# Patient Record
Sex: Female | Born: 1980
Health system: Southern US, Community
[De-identification: ages and names within clinical notes are randomized; demographics above are authoritative.]

## PROBLEM LIST (undated history)

## (undated) DIAGNOSIS — I10 Essential (primary) hypertension: Secondary | ICD-10-CM

## (undated) DIAGNOSIS — Z8489 Family history of other specified conditions: Secondary | ICD-10-CM

## (undated) DIAGNOSIS — F32A Depression, unspecified: Secondary | ICD-10-CM

## (undated) DIAGNOSIS — F319 Bipolar disorder, unspecified: Secondary | ICD-10-CM

## (undated) DIAGNOSIS — Z87442 Personal history of urinary calculi: Secondary | ICD-10-CM

## (undated) DIAGNOSIS — F419 Anxiety disorder, unspecified: Secondary | ICD-10-CM

## (undated) DIAGNOSIS — K219 Gastro-esophageal reflux disease without esophagitis: Secondary | ICD-10-CM

## (undated) DIAGNOSIS — E669 Obesity, unspecified: Secondary | ICD-10-CM

## (undated) DIAGNOSIS — F329 Major depressive disorder, single episode, unspecified: Secondary | ICD-10-CM

## (undated) HISTORY — DX: Gastro-esophageal reflux disease without esophagitis: K21.9

## (undated) HISTORY — DX: Obesity, unspecified: E66.9

## (undated) HISTORY — DX: Anxiety disorder, unspecified: F41.9

## (undated) HISTORY — PX: WISDOM TOOTH EXTRACTION: SHX21

---

## 2016-07-13 ENCOUNTER — Emergency Department (INDEPENDENT_AMBULATORY_CARE_PROVIDER_SITE_OTHER)
Admission: EM | Admit: 2016-07-13 | Discharge: 2016-07-13 | Disposition: A | Discharge status: Home / Self Care | Payer: BLUE CROSS/BLUE SHIELD | Source: Home / Self Care | Attending: Family Medicine | Admitting: Family Medicine

## 2016-07-13 ENCOUNTER — Emergency Department (INDEPENDENT_AMBULATORY_CARE_PROVIDER_SITE_OTHER): Payer: BLUE CROSS/BLUE SHIELD

## 2016-07-13 DIAGNOSIS — S90212A Contusion of left great toe with damage to nail, initial encounter: Secondary | ICD-10-CM

## 2016-07-13 DIAGNOSIS — X58XXXA Exposure to other specified factors, initial encounter: Secondary | ICD-10-CM | POA: Diagnosis not present

## 2016-07-13 DIAGNOSIS — S90112A Contusion of left great toe without damage to nail, initial encounter: Secondary | ICD-10-CM

## 2016-07-13 HISTORY — DX: Major depressive disorder, single episode, unspecified: F32.9

## 2016-07-13 HISTORY — DX: Bipolar disorder, unspecified: F31.9

## 2016-07-13 HISTORY — DX: Depression, unspecified: F32.A

## 2016-07-13 MED ORDER — IBUPROFEN 400 MG PO TABS
400.0000 mg | ORAL_TABLET | Freq: Once | ORAL | Status: AC
  Administered 2016-07-13: 400 mg via ORAL

## 2016-07-13 MED ORDER — TETANUS-DIPHTH-ACELL PERTUSSIS 5-2.5-18.5 LF-MCG/0.5 IM SUSP
0.5000 mL | Freq: Once | INTRAMUSCULAR | Status: AC
  Administered 2016-07-13: 0.5 mL via INTRAMUSCULAR

## 2016-07-13 NOTE — ED Provider Notes (Signed)
[facility]    CSN: [id number] Arrival date & time: 07/13/16  1818     History   Chief Complaint Chief Complaint  Patient presents with  . Foot Pain    HPI Debra Andrade is a 36 y.o. female.   Patient bumped her left great toe on a metal door about 6 hours ago.  She does not remember her last Tdap.   The history is provided by the patient.  Toe Pain  This is a new problem. The current episode started 6 to 12 hours ago. The problem occurs constantly. The problem has not changed since onset.The symptoms are aggravated by walking. Nothing relieves the symptoms. He has tried nothing for the symptoms.    Past Medical History:  Diagnosis Date  . Bipolar affective (HCC)   . Depression     There are no active problems to display for this patient.   History reviewed. No pertinent surgical history.     Home Medications    Prior to Admission medications   Medication Sig Start Date End Date Taking? Authorizing Provider  fluvoxaMINE (LUVOX) 100 MG tablet Take 100 mg by mouth at bedtime.   Yes Historical Provider, MD  gabapentin (NEURONTIN) 300 MG capsule Take 300 mg by mouth 3 (three) times daily.   Yes Historical Provider, MD  lamoTRIgine (LAMICTAL) 150 MG tablet Take 300 mg by mouth daily.   Yes Historical Provider, MD  lurasidone (LATUDA) 40 MG TABS tablet Take 40 mg by mouth daily with breakfast.   Yes Historical Provider, MD    Family History History reviewed. No pertinent family history.  Social History Social History  Substance Use Topics  . Smoking status: Never Smoker  . Smokeless tobacco: Never Used  . Alcohol use No     Allergies   Amoxicillin   Review of Systems Review of Systems  All other systems reviewed and are negative.    Physical Exam Triage Vital Signs ED Triage Vitals  Enc Vitals Group     BP 07/13/16 1850 (!) 154/94     Pulse Rate 07/13/16 1850 82     Resp --      Temp 07/13/16 1850 98 F (36.7 C)     Temp Source  07/13/16 1850 Oral     SpO2 07/13/16 1850 97 %     Weight 07/13/16 1850 241 lb (109.3 kg)     Height 07/13/16 1850  (1.702 m)     Head Circumference --      Peak Flow --      Pain Score 07/13/16 1851 5     Pain Loc --      Pain Edu? --      Excl. in GC? --    No data found.   Updated Vital Signs BP (!) 154/94 (BP Location: Left Arm)   Pulse 82   Temp 98 F (36.7 C) (Oral)   Ht  (1.702 m)   Wt 241 lb (109.3 kg)   SpO2 97%   BMI 37.75 kg/m   Visual Acuity Right Eye Distance:   Left Eye Distance:   Bilateral Distance:    Right Eye Near:   Left Eye Near:    Bilateral Near:     Physical Exam  Constitutional: He appears well-developed and well-nourished. No distress.  HENT:  Head: Atraumatic.  Eyes: Pupils are equal, round, and reactive to light.  Cardiovascular: Normal rate.   Pulmonary/Chest: Effort normal.  Musculoskeletal: He exhibits tenderness. He exhibits  no deformity.       Left foot: There is tenderness. There is normal range of motion.       Feet:  Left great toe has good range of motion.  There is a superficial abrasion and tenderness at the medial edge of toenail as noted on diagram.  Toenail is intact; no laceration.  Neurological: He is alert.  Skin: Skin is warm and dry.  Nursing note and vitals reviewed.    UC Treatments / Results  Labs (all labs ordered are listed, but only abnormal results are displayed) Labs Reviewed - No data to display  EKG  EKG Interpretation None       Radiology Dg Toe Great Left  Result Date: 07/13/2016 CLINICAL DATA:  Big toe injury. EXAM: LEFT GREAT TOE COMPARISON:  None. FINDINGS: There is no evidence of fracture or dislocation. There is no evidence of arthropathy or other focal bone abnormality. Soft tissue irregularity of the nail suggest laceration. IMPRESSION: No acute bony abnormality. Electronically Signed   By: Kennith Center M.D.   On: 07/13/2016 19:54    Procedures Procedures (including  critical care time)  Medications Ordered in UC Medications  ibuprofen (ADVIL,MOTRIN) tablet 400 mg (400 mg Oral Given 07/13/16 1859)  Tdap (BOOSTRIX) injection 0.5 mL (0.5 mLs Intramuscular Given 07/13/16 1950)     Initial Impression / Assessment and Plan / UC Course  I have reviewed the triage vital signs and the nursing notes.  Pertinent labs & imaging results that were available during my care of the patient were reviewed by me and considered in my medical decision making (see chart for details).    Administered Tdap  Applied Bacitracin ointment and wrap with Xeroform gauzed, followed by Kerlix gauze and light wrap of CoBan. Change bandage daily and apply Bacitracin ointment until healed.  Elevate leg.  Apply ice pack for 20 to 30 minutes, 3 to 4 times daily  Continue until pain and swelling decrease.  May take Ibuprofen or Tylenol as needed for pain. Keep wound clean and dry.  Return for any signs of infection (or follow-up with family doctor):  Increasing redness, swelling, pain, heat, drainage, etc.    Final Clinical Impressions(s) / UC Diagnoses   Final diagnoses:  Contusion of left great toe with damage to nail, initial encounter    New Prescriptions New Prescriptions   No medications on file     Lattie Haw, MD 07/25/16 1410

## 2016-07-13 NOTE — ED Triage Notes (Signed)
Pt opened metal door today and hit big toe.  Bleeding noted, throbs then has sharp pain between the nail and the joint.

## 2016-07-13 NOTE — Discharge Instructions (Signed)
Change bandage daily and apply Bacitracin ointment until healed.  Elevate leg.  Apply ice pack for 20 to 30 minutes, 3 to 4 times daily  Continue until pain and swelling decrease.  May take Ibuprofen or Tylenol as needed for pain. Keep wound clean and dry.  Return for any signs of infection (or follow-up with family doctor):  Increasing redness, swelling, pain, heat, drainage, etc.

## 2017-05-18 ENCOUNTER — Ambulatory Visit: Payer: BLUE CROSS/BLUE SHIELD | Admitting: Osteopathic Medicine

## 2017-05-18 ENCOUNTER — Telehealth: Payer: Self-pay | Admitting: Osteopathic Medicine

## 2017-05-18 NOTE — Telephone Encounter (Signed)
Called pt and lvm to try and get her rescheduled and informed her of policy. Thanks

## 2017-05-18 NOTE — Telephone Encounter (Signed)
NO SHOW to est care w. Dr Lyn HollingsheadAlexander 05/18/17. If late/no-show again will not accept pt at this practice, please call to reschedule and inform them of this policy, thanks.

## 2017-05-19 ENCOUNTER — Ambulatory Visit: Payer: BLUE CROSS/BLUE SHIELD | Admitting: Osteopathic Medicine

## 2017-05-31 ENCOUNTER — Ambulatory Visit (INDEPENDENT_AMBULATORY_CARE_PROVIDER_SITE_OTHER): Payer: 59 | Admitting: Physician Assistant

## 2017-05-31 ENCOUNTER — Encounter: Payer: Self-pay | Admitting: Physician Assistant

## 2017-05-31 ENCOUNTER — Encounter (INDEPENDENT_AMBULATORY_CARE_PROVIDER_SITE_OTHER): Payer: Self-pay

## 2017-05-31 VITALS — BP 150/80 | HR 97 | Ht 67.0 in | Wt 241.0 lb

## 2017-05-31 DIAGNOSIS — F419 Anxiety disorder, unspecified: Secondary | ICD-10-CM

## 2017-05-31 DIAGNOSIS — R195 Other fecal abnormalities: Secondary | ICD-10-CM | POA: Diagnosis not present

## 2017-05-31 DIAGNOSIS — Z1329 Encounter for screening for other suspected endocrine disorder: Secondary | ICD-10-CM

## 2017-05-31 DIAGNOSIS — R030 Elevated blood-pressure reading, without diagnosis of hypertension: Secondary | ICD-10-CM | POA: Diagnosis not present

## 2017-05-31 DIAGNOSIS — Z7689 Persons encountering health services in other specified circumstances: Secondary | ICD-10-CM | POA: Diagnosis not present

## 2017-05-31 DIAGNOSIS — K219 Gastro-esophageal reflux disease without esophagitis: Secondary | ICD-10-CM | POA: Diagnosis not present

## 2017-05-31 DIAGNOSIS — Z131 Encounter for screening for diabetes mellitus: Secondary | ICD-10-CM

## 2017-05-31 DIAGNOSIS — Z130 Encounter for screening for diseases of the blood and blood-forming organs and certain disorders involving the immune mechanism: Secondary | ICD-10-CM

## 2017-05-31 DIAGNOSIS — Z13220 Encounter for screening for lipoid disorders: Secondary | ICD-10-CM

## 2017-05-31 MED ORDER — FLUVOXAMINE MALEATE 100 MG PO TABS: 100.0000 mg | ORAL_TABLET | Freq: Every day | ORAL | 3 refills | Status: DC

## 2017-05-31 NOTE — Progress Notes (Signed)
HPI:                                                                Debra Andrade is a 37 y.o. female who presents to [name] today to establish care  Current concerns include: digestive issues  Reports for the last 3-4 months she has had a change in her bowel habits. Describes loose, discolored stools about half the time when she has a BM. She has had 2 occasions where she has seen a long, white stringy substance in her stool. She is stooling once daily. Denies frequency, urgency, or tenesmus. Denies constitutional symptoms, abdominal pain, nausea/vomting, hematochezia or melena. Denies recent travel. Denies recent antibiotic use. Eats a regular diet ,but does endorse increased fast food intake.   GERD: has been taking OTC Prilosec for the last 2 weeks. Father has Barrett's esophagus and esophageal cancer.  Bipolar 2: she reports she self-tapered her medications in early November because she was feeling good. Not currently followed by Psychiatry, but plans to return to [facility] as needed. States her mood has been great, denies symptoms of mania or hypomania. Reports she has noted increased anxiety, gradually worsening. She has a history of obsessions/compulsions and was taking Luvox for this.   No flowsheet data found.  No flowsheet data found.    Past Medical History:  Diagnosis Date  . Anxiety   . Bipolar affective (HCC)   . Depression   . GERD (gastroesophageal reflux disease)    No past surgical history on file. Social History   Tobacco Use  . Smoking status: Never Smoker  . Smokeless tobacco: Never Used  Substance Use Topics  . Alcohol use: No   family history includes Barrett's esophagus in her father; Esophageal cancer in her father; Kidney disease in her mother.    ROS: Review of Systems  Constitutional: Negative.   Eyes: Positive for blurred vision.  Gastrointestinal: Positive for diarrhea (loose stools).  Negative for abdominal pain, blood in stool, melena, nausea and vomiting.       + early satiety  Skin: Positive for rash.  Endo/Heme/Allergies: Positive for environmental allergies.  Psychiatric/Behavioral: The patient is nervous/anxious.      Medications: Current Outpatient Medications  Medication Sig Dispense Refill  . cetirizine (ZYRTEC) 10 MG tablet Take 10 mg by mouth daily.    . Cranberry 360 MG CAPS Take by mouth.    . cyanocobalamin 500 MCG tablet Take 500 mcg by mouth daily.    Marland Kitchen omeprazole (PRILOSEC) 20 MG capsule Take 20 mg by mouth daily.    . potassium citrate (UROCIT-K) 10 MEQ (1080 MG) SR tablet Take 10 mEq by mouth 3 (three) times daily with meals.    . fluvoxaMINE (LUVOX) 100 MG tablet Take 1 tablet (100 mg total) by mouth at bedtime. 30 tablet 3   No current facility-administered medications for this visit.    Allergies  Allergen Reactions  . Amoxicillin        Objective:  BP (!) 150/80   Pulse 97   Ht 5\' 7"  (1.702 m)   Wt 241 lb (109.3 kg)   LMP 05/15/2017 (Exact Date)   BMI 37.75 kg/m  Gen:  alert, not ill-appearing, no distress, appropriate for age,  obese female HEENT: head normocephalic without obvious abnormality, conjunctiva and cornea clear, wearing glasses, oropharynx clear,cobblestoning of posterior tongue, moist mucous membranes, neck supple, no adenopathy, trachea midline Pulm: Normal work of breathing, normal phonation, clear to auscultation bilaterally, no wheezes, rales or rhonchi CV: Normal rate, regular rhythm, s1 and s2 distinct, no murmurs, clicks or rubs  GI: abdomen obese, soft, distended, no tenderness, rigidity or guarding, negative Murphy's sign, no palpable masses Neuro: alert and oriented x 3, no tremor MSK: extremities atraumatic, normal gait and station Skin: intact, no rashes on exposed skin, no jaundice, no cyanosis Psych: well-groomed, cooperative, good eye contact, euthymic mood, affect mood-congruent, speech is articulate,  and thought processes clear and goal-directed    No results found for this or any previous visit (from the past 72 hour(s)). No results found.    Assessment and Plan: 37 y.o. female with  1. Encounter to establish care - reviewed PMH, PSH, PFh, medications and allergies - reviewed health maintenance - due for Pap smear - declines influenza - Tdap UTD  2. Elevated blood pressure reading BP Readings from Last 3 Encounters:  05/31/17 (!) 150/80  07/13/16 (!) 154/94  -counseled on therapeutic lifestyle changes - patient to monitor and log readings at home  3. Anxiety disorder, unspecified type - re-starting patients luvox, 50 mg QHS for 3 days then 100 mg QHS. She will follow-up with psychiatry as needed for mood/bipolar symptoms - fluvoxaMINE (LUVOX) 100 MG tablet; Take 1 tablet (100 mg total) by mouth at bedtime.  Dispense: 30 tablet; Refill: 3  4. Loose stools - she has no risk factors for C. Diff. Checking labs, stool culture and ova/parasite - CBC with Differential/Platelet - Comprehensive metabolic panel - Hemoglobin A1c - Lipid Panel w/reflex Direct LDL - Lipase - Ova and parasite examination - Stool Culture  5. Gastroesophageal reflux disease, esophagitis presence not specified - no red flag symptoms - cont Omeprazole 20 mg daily - GERD diet  6. Screening for blood disease - CBC with Differential/Platelet - Comprehensive metabolic panel  7. Screening for diabetes mellitus (DM) - Hemoglobin A1c  8. Screening for lipid disorders - Lipid Panel w/reflex Direct LDL  9. Screening for thyroid disorder - TSH + free T4  Patient education and anticipatory guidance given Patient agrees with treatment plan Follow-up in 4 weeks for digestive issues/anxiety or sooner as needed if symptoms worsen or fail to improve  Levonne Hubertharley E. Arion Morgan PA-C

## 2017-05-31 NOTE — Patient Instructions (Addendum)
For loose stools: - labs and stool studies today - food diary - continue the Prilosec 20 mg daily - follow-up in 4 weeks  For your blood pressure: - Goal <130/80 - stop Sudafed and avoid oral decongestants - monitor and log blood pressures at home - check around the same time each day in a relaxed setting - Limit salt to <2000 mg/day - Follow DASH eating plan - limit alcohol to 2 standard drinks per day for men and 1 per day for women - avoid tobacco products - weight loss: 7% of current body weight - follow-up every 6 months for your blood pressure   For anxiety: - re-start Luvox. Start with 1/2 tablet at bedtime for 3 days and then increase to full tablet at bedtime

## 2017-06-01 LAB — CBC WITH DIFFERENTIAL/PLATELET
BASOS PCT: 0.7 %
Basophils Absolute: 78 cells/uL (ref 0–200)
EOS ABS: 56 {cells}/uL (ref 15–500)
Eosinophils Relative: 0.5 %
HEMATOCRIT: 44.2 % (ref 35.0–45.0)
Hemoglobin: 14.9 g/dL (ref 11.7–15.5)
LYMPHS ABS: 3091 {cells}/uL (ref 850–3900)
MCH: 30.3 pg (ref 27.0–33.0)
MCHC: 33.7 g/dL (ref 32.0–36.0)
MCV: 90 fL (ref 80.0–100.0)
MPV: 9.9 fL (ref 7.5–12.5)
Monocytes Relative: 5 %
Neutro Abs: 7414 cells/uL (ref 1500–7800)
Neutrophils Relative %: 66.2 %
PLATELETS: 352 10*3/uL (ref 140–400)
RBC: 4.91 10*6/uL (ref 3.80–5.10)
RDW: 12.1 % (ref 11.0–15.0)
TOTAL LYMPHOCYTE: 27.6 %
WBC: 11.2 10*3/uL — ABNORMAL HIGH (ref 3.8–10.8)
WBCMIX: 560 {cells}/uL (ref 200–950)

## 2017-06-01 LAB — COMPREHENSIVE METABOLIC PANEL
AG Ratio: 1.8 (calc) (ref 1.0–2.5)
ALBUMIN MSPROF: 4.6 g/dL (ref 3.6–5.1)
ALT: 18 U/L (ref 6–29)
AST: 16 U/L (ref 10–30)
Alkaline phosphatase (APISO): 71 U/L (ref 33–115)
BILIRUBIN TOTAL: 0.6 mg/dL (ref 0.2–1.2)
BUN: 13 mg/dL (ref 7–25)
CO2: 22 mmol/L (ref 20–32)
CREATININE: 0.76 mg/dL (ref 0.50–1.10)
Calcium: 9.7 mg/dL (ref 8.6–10.2)
Chloride: 107 mmol/L (ref 98–110)
GLUCOSE: 88 mg/dL (ref 65–99)
Globulin: 2.6 g/dL (calc) (ref 1.9–3.7)
POTASSIUM: 4.2 mmol/L (ref 3.5–5.3)
SODIUM: 140 mmol/L (ref 135–146)
TOTAL PROTEIN: 7.2 g/dL (ref 6.1–8.1)

## 2017-06-01 LAB — LIPID PANEL W/REFLEX DIRECT LDL
CHOLESTEROL: 210 mg/dL — AB (ref <200)
HDL: 56 mg/dL (ref >50)
LDL CHOLESTEROL (CALC): 130 mg/dL — AB
Non-HDL Cholesterol (Calc): 154 mg/dL (calc) — ABNORMAL HIGH (ref <130)
Total CHOL/HDL Ratio: 3.8 (calc) (ref <5.0)
Triglycerides: 127 mg/dL (ref <150)

## 2017-06-01 LAB — HEMOGLOBIN A1C
EAG (MMOL/L): 5.5 (calc)
HEMOGLOBIN A1C: 5.1 %{Hb} (ref <5.7)
Mean Plasma Glucose: 100 (calc)

## 2017-06-01 LAB — LIPASE: Lipase: 10 U/L (ref 7–60)

## 2017-06-01 LAB — TSH+FREE T4: TSH W/REFLEX TO FT4: 2.99 m[IU]/L

## 2017-06-03 NOTE — Progress Notes (Signed)
Hi Debra Andrade,  Your labs look good overall!  - you have a mild increase in your total white blood cell count, but all of your other cell lines are normal, so this is reassuring. It may indicate that the cause of your stool abnormalities is due to an infection. Your stool studies are still pending. - your cholesterol is borderline elevated. Work on [employer]-fat diet and regular aerobic exercise to help lower this.  Best, Vinetta Bergamoharley

## 2017-06-06 LAB — STOOL CULTURE
MICRO NUMBER:: 90294052
MICRO NUMBER:: 90294053
MICRO NUMBER:: 90294054
SHIGA RESULT:: NOT DETECTED
SPECIMEN QUALITY: ADEQUATE
SPECIMEN QUALITY: ADEQUATE
SPECIMEN QUALITY:: ADEQUATE

## 2017-06-07 ENCOUNTER — Ambulatory Visit: Payer: Self-pay | Admitting: Physician Assistant

## 2017-06-09 LAB — OVA AND PARASITE EXAMINATION
CONCENTRATE RESULT: NONE SEEN
TRICHROME RESULT: NONE SEEN

## 2017-06-09 NOTE — Progress Notes (Signed)
Good morning Lakynn,  Good news: your stool studies were negative for bacteria and parasites.  Best, Vinetta Bergamoharley

## 2017-06-16 ENCOUNTER — Telehealth: Payer: Self-pay

## 2017-06-16 NOTE — Telephone Encounter (Signed)
Received a return letter due to incorrect address. Patient's address needs to be updated.

## 2017-06-30 ENCOUNTER — Ambulatory Visit: Payer: 59 | Admitting: Physician Assistant

## 2017-06-30 ENCOUNTER — Encounter: Payer: 59 | Admitting: Obstetrics & Gynecology

## 2017-06-30 DIAGNOSIS — Z01419 Encounter for gynecological examination (general) (routine) without abnormal findings: Secondary | ICD-10-CM

## 2017-07-28 ENCOUNTER — Ambulatory Visit: Payer: 59 | Admitting: Physician Assistant

## 2017-08-02 ENCOUNTER — Ambulatory Visit: Payer: 59 | Admitting: Physician Assistant

## 2018-01-13 ENCOUNTER — Ambulatory Visit (INDEPENDENT_AMBULATORY_CARE_PROVIDER_SITE_OTHER): Payer: BLUE CROSS/BLUE SHIELD | Admitting: Obstetrics & Gynecology

## 2018-01-13 ENCOUNTER — Encounter: Payer: Self-pay | Admitting: Obstetrics & Gynecology

## 2018-01-13 DIAGNOSIS — Z124 Encounter for screening for malignant neoplasm of cervix: Secondary | ICD-10-CM

## 2018-01-13 DIAGNOSIS — Z1151 Encounter for screening for human papillomavirus (HPV): Secondary | ICD-10-CM | POA: Diagnosis not present

## 2018-01-13 DIAGNOSIS — Z23 Encounter for immunization: Secondary | ICD-10-CM

## 2018-01-13 DIAGNOSIS — Z01419 Encounter for gynecological examination (general) (routine) without abnormal findings: Secondary | ICD-10-CM | POA: Diagnosis not present

## 2018-01-13 NOTE — Progress Notes (Signed)
Subjective:    Debra Andrade is a 37 y.o. married P0 female who presents for an annual exam. The patient has no complaints today. The patient is sexually active rarely, last about 6 years. GYN screening history: last pap: was normal. The patient wears seatbelts: yes. The patient participates in regular exercise: no. Has the patient ever been transfused or tattooed?: no. The patient reports that there is not domestic violence in her life.   Menstrual History: OB History    Gravida  1   Para  0   Term      Preterm      AB  1   Living        SAB      TAB      Ectopic      Multiple      Live Births              Menarche age: 28 Patient's last menstrual period was 01/04/2018.    The following portions of the patient's history were reviewed and updated as appropriate: allergies, current medications, past family history, past medical history, past social history, past surgical history and problem list.  Review of Systems Pertinent items are noted in HPI.   Fh-maybe maternal GM, paternal aunt, + colon cancer- paternal GF, no gyn Works at [employer] Married for 10 years     Objective:    BP (!) 148/95   Pulse (!) 105   Resp 16   Ht 5\' 7"  (1.702 m)   Wt 253 lb (114.8 kg)   LMP 01/04/2018   BMI 39.63 kg/m   General Appearance:    Alert, cooperative, no distress, appears stated age  Head:    Normocephalic, without obvious abnormality, atraumatic  Eyes:    PERRL, conjunctiva/corneas clear, EOM's intact, fundi    benign, both eyes  Ears:    Normal TM's and external ear canals, both ears  Nose:   Nares normal, septum midline, mucosa normal, no drainage    or sinus tenderness  Throat:   Lips, mucosa, and tongue normal; teeth and gums normal  Neck:   Supple, symmetrical, trachea midline, no adenopathy;    thyroid:  no enlargement/tenderness/nodules; no carotid   bruit or JVD  Back:     Symmetric, no curvature, ROM normal, no CVA tenderness  Lungs:     Clear to  auscultation bilaterally, respirations unlabored  Chest Wall:    No tenderness or deformity   Heart:    Regular rate and rhythm, S1 and S2 normal, no murmur, rub   or gallop  Breast Exam:    No tenderness, masses, or nipple abnormality  Abdomen:     Soft, non-tender, bowel sounds active all four quadrants,    no masses, no organomegaly  Genitalia:    Normal female without lesion, discharge or tenderness, cervix appears normal, no masses palpable with bimanual exam     Extremities:   Extremities normal, atraumatic, no cyanosis or edema  Pulses:   2+ and symmetric all extremities  Skin:   Skin color, texture, turgor normal, no rashes or lesions  Lymph nodes:   Cervical, supraclavicular, and axillary nodes normal  Neurologic:   CNII-XII intact, normal strength, sensation and reflexes    throughout  .    Assessment:    Healthy female exam.    Plan:     Thin prep Pap smear. with cotesting Referral to bariatrics Flu vaccine

## 2018-01-18 LAB — CYTOLOGY - PAP
DIAGNOSIS: NEGATIVE
HPV (WINDOPATH): NOT DETECTED

## 2018-02-10 ENCOUNTER — Encounter: Payer: Self-pay | Admitting: Physician Assistant

## 2018-02-10 ENCOUNTER — Ambulatory Visit (INDEPENDENT_AMBULATORY_CARE_PROVIDER_SITE_OTHER): Payer: BLUE CROSS/BLUE SHIELD | Admitting: Physician Assistant

## 2018-02-10 VITALS — BP 148/105 | HR 98 | Wt 255.0 lb

## 2018-02-10 DIAGNOSIS — Z0000 Encounter for general adult medical examination without abnormal findings: Secondary | ICD-10-CM | POA: Diagnosis not present

## 2018-02-10 DIAGNOSIS — Z130 Encounter for screening for diseases of the blood and blood-forming organs and certain disorders involving the immune mechanism: Secondary | ICD-10-CM

## 2018-02-10 DIAGNOSIS — N926 Irregular menstruation, unspecified: Secondary | ICD-10-CM | POA: Diagnosis not present

## 2018-02-10 DIAGNOSIS — R5383 Other fatigue: Secondary | ICD-10-CM | POA: Diagnosis not present

## 2018-02-10 DIAGNOSIS — E8881 Metabolic syndrome: Secondary | ICD-10-CM

## 2018-02-10 DIAGNOSIS — I10 Essential (primary) hypertension: Secondary | ICD-10-CM | POA: Diagnosis not present

## 2018-02-10 DIAGNOSIS — Z6841 Body Mass Index (BMI) 40.0 and over, adult: Secondary | ICD-10-CM

## 2018-02-10 DIAGNOSIS — Z6839 Body mass index (BMI) 39.0-39.9, adult: Secondary | ICD-10-CM

## 2018-02-10 MED ORDER — AMLODIPINE BESYLATE 5 MG PO TABS: 5.0000 mg | ORAL_TABLET | Freq: Every day | ORAL | 5 refills | Status: DC

## 2018-02-10 MED ORDER — SEMAGLUTIDE(0.25 OR 0.5MG/DOS) 2 MG/1.5ML ~~LOC~~ SOPN: PEN_INJECTOR | SUBCUTANEOUS | 0 refills | Status: DC

## 2018-02-10 NOTE — Progress Notes (Signed)
HPI:                                                                Debra Andrade is a 37 y.o. 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 States her stress level is improved, but she is gaining weight because she is sedentary. Has been trying to lose weight with diet modification and portion control, but has gained about 15 pounds this year.   Also states she has been getting her menstrual cycle about every 3 weeks and she finds herself extremely fatigued with "no energy" about 1 day prior to the onset of her menses. Her last cycle was unusually heavy.   Depression screen PHQ 2/9 02/10/2018  Decreased Interest 0  Down, Depressed, Hopeless 0  PHQ - 2 Score 0  Altered sleeping 1  Tired, decreased energy 1  Change in appetite 0  Feeling bad or failure about yourself  0  Trouble concentrating 0  Moving slowly or fidgety/restless 0  Suicidal thoughts 0  PHQ-9 Score 2    GAD 7 : Generalized Anxiety Score 02/10/2018  Nervous, Anxious, on Edge 1  Control/stop worrying 0  Worry too much - different things 1  Trouble relaxing 1  Restless 0  Easily annoyed or irritable 0  Afraid - awful might happen 0  Total GAD 7 Score 3      Past Medical History:  Diagnosis Date  . Anxiety   . Bipolar affective (HCC)   . Depression   . GERD (gastroesophageal reflux disease)   . Obesity    Past Surgical History:  Procedure Laterality Date  . WISDOM TOOTH EXTRACTION     Social History   Tobacco Use  . Smoking status: Never Smoker  . Smokeless tobacco: Never Used  Substance Use Topics  . Alcohol use: Yes    Alcohol/week: 1.0 standard drinks    Types: 1 Standard drinks or equivalent per week   family history includes Barrett's esophagus in her father; Bipolar disorder in her brother; Esophageal cancer in her father; Hyperlipidemia in her father; Hypertension in her father;  Kidney disease in her mother.    ROS: negative except as noted in the HPI  Medications: Current Outpatient Medications  Medication Sig Dispense Refill  . cetirizine (ZYRTEC) 10 MG tablet Take 10 mg by mouth daily.    . Cranberry 360 MG CAPS Take by mouth.    . cyanocobalamin 500 MCG tablet Take 500 mcg by mouth daily.    Marland Kitchen omeprazole (PRILOSEC) 20 MG capsule Take 20 mg by mouth daily.    . potassium citrate (UROCIT-K) 10 MEQ (1080 MG) SR tablet Take 10 mEq by mouth 3 (three) times daily with meals.    Marland Kitchen amLODipine (NORVASC) 5 MG tablet Take 1 tablet (5 mg total) by mouth daily. After 1 week, may increase to 10 mg QD if BP not at goal <130/80 30 tablet 5  . Semaglutide,0.25 or 0.5MG /DOS, (OZEMPIC, 0.25 OR 0.5 MG/DOSE,) 2 MG/1.5ML SOPN Inject 0.25 mg into the skin once a week for 28 days, THEN 0.5 mg once a week for 28 days. 0.25 mg SQ once weekly for 4 weeks then increase to 0.5  mg once weekly. 1 pen 0   No current facility-administered medications for this visit.    Allergies  Allergen Reactions  . Amoxicillin Rash       Objective:  BP (!) 148/105   Pulse 98   Wt 255 lb (115.7 kg)   LMP 01/31/2018 (Exact Date)   BMI 39.94 kg/m  General Appearance:  Alert, cooperative, no distress, appropriate for age, obese female                            Head:  Normocephalic, without obvious abnormality                             Eyes:  PERRL, EOM's intact, conjunctiva and cornea clear, wearing glasses                             Ears:  TM pearly gray color and semitransparent, external ear canals normal, both ears                            Nose:  Nares symmetrical, mucosa pink                          Throat:  Lips, tongue, and mucosa are moist, pink, and intact; oropharynx clear, uvula midline; good dentition                             Neck:  Supple; symmetrical, trachea midline, no adenopathy; thyroid: no enlargement, symmetric, no tenderness/mass/nodules                              Back:  Symmetrical, no curvature, ROM normal               Chest/Breast:  deferred                           Lungs:  Clear to auscultation bilaterally, respirations unlabored                             Heart:  normal rate & regular rhythm, S1 and S2 normal, no murmurs, rubs, or gallops                     Abdomen:  Soft, non-tender, no rigidity, exam limited due to body habitus              Genitourinary:  deferred         Musculoskeletal:  Tone and strength strong and symmetrical, all extremities; no joint pain or edema, normal gait and station                                       Lymphatic:  No adenopathy             Skin/Hair/Nails:  Skin warm, dry and intact, no rashes or abnormal dyspigmentation on limited exam                   Neurologic:  Alert and  oriented x3, no cranial nerve deficits, sensation grossly intact, normal gait and station, no tremor Psych: well-groomed, cooperative, good eye contact, euthymic mood, affect mood-congruent, speech is articulate, and thought processes clear and goal-directed  Lab Results  Component Value Date   CREATININE 0.76 05/31/2017   BUN 13 05/31/2017   NA 140 05/31/2017   K 4.2 05/31/2017   CL 107 05/31/2017   CO2 22 05/31/2017    Lab Results  Component Value Date   ALT 18 05/31/2017   AST 16 05/31/2017   BILITOT 0.6 05/31/2017   Lab Results  Component Value Date   WBC 11.2 (H) 05/31/2017   HGB 14.9 05/31/2017   HCT 44.2 05/31/2017   MCV 90.0 05/31/2017   PLT 352 05/31/2017   Lab Results  Component Value Date   HGBA1C 5.1 05/31/2017   BP Readings from Last 3 Encounters:  02/10/18 (!) 148/105  01/13/18 (!) 148/95  05/31/17 (!) 150/80     No results found for this or any previous visit (from the past 72 hour(s)). No results found.    Assessment and Plan: 37 y.o. female with   .De was seen today for annual exam.  Diagnoses and all orders for this visit:  Encounter for annual physical exam -     Fe+TIBC+Fer -      CBC with Differential/Platelet -     Comprehensive metabolic panel  Uncontrolled hypertension -     Comprehensive metabolic panel -     amLODipine (NORVASC) 5 MG tablet; Take 1 tablet (5 mg total) by mouth daily. After 1 week, may increase to 10 mg QD if BP not at goal <130/80  Class 2 severe obesity due to excess calories with serious comorbidity and body mass index (BMI) of 39.0 to 39.9 in adult (HCC) -     Semaglutide,0.25 or 0.5MG /DOS, (OZEMPIC, 0.25 OR 0.5 MG/DOSE,) 2 MG/1.5ML SOPN; Inject 0.25 mg into the skin once a week for 28 days, THEN 0.5 mg once a week for 28 days. 0.25 mg SQ once weekly for 4 weeks then increase to 0.5 mg once weekly.  Irregular menses  Fatigue, unspecified type -     Fe+TIBC+Fer -     CBC with Differential/Platelet  Screening for blood disease -     Fe+TIBC+Fer -     CBC with Differential/Platelet  Metabolic syndrome -     Semaglutide,0.25 or 0.5MG /DOS, (OZEMPIC, 0.25 OR 0.5 MG/DOSE,) 2 MG/1.5ML SOPN; Inject 0.25 mg into the skin once a week for 28 days, THEN 0.5 mg once a week for 28 days. 0.25 mg SQ once weekly for 4 weeks then increase to 0.5 mg once weekly.   - Personally reviewed PMH, PSH, PFH, medications, allergies, HM - Age-appropriate cancer screening: Pap UTD - Influenza UTD - Tdap UTD - PHQ2 negative - Fasting labs completed 05/2017 and personally reviewed by me. Will follow-up on mild leukocytosis and check iron panel for heavy menstrual bleeding/fatigue  HTN: uncontrolled since 06/2016. Strongly encouraged patient to consider medication management. She was amenable to this. Starting Amlodipine 5 mg, patient instructed on how to self-titrate to goal BP<130/80. Counseled on therapeutic lifestyle changes. D/c Sudafed. Medical assisted weight loss.  Obesity Class 2: BMI 39, almost in class 3 category with recent weight gain. Will try to get Ozempic approved for her but she has a high deductible plan so we may need to do some off-label  alternatives. She was counseled on weight loss through 1700-1900 calorie heart healthy diet and increased physical  activity.      Patient education and anticipatory guidance given Patient agrees with treatment plan Follow-up in 1 month for HTN and weight mgmt or sooner as needed if symptoms worsen or fail to improve  Levonne Hubertharley E. Naethan Bracewell PA-C

## 2018-02-10 NOTE — Patient Instructions (Addendum)
For your blood pressure: - Goal <130/80 (Ideally 120's/70's) - take your blood pressure medication in the morning (unless instructed differently) - take baby aspirin 81 mg daily to help prevent heart attack/stroke - monitor and log blood pressures at home - check around the same time each day in a relaxed setting - Limit salt to <2500 mg/day - Follow DASH (Dietary Approach to Stopping Hypertension) eating plan - Try to get at least 150 minutes of aerobic exercise per week - Aim to go on a brisk walk 30 minutes per day at least 5 days per week. If you're not active, gradually increase how long you walk by 5 minutes each week - limit alcohol: 2 standard drinks per day for men and 1 per day for women - avoid tobacco/nicotine products. Consider smoking cessation if you smoke - weight loss: 7% of current body weight can reduce your blood pressure by 5-10 points - follow-up at least every 6 months for your blood pressure. Follow-up sooner if your BP is not controlled  For weight loss: 1700 calories/day to lose 1 pound per week  Or 1900 calories/day to lose 1/2 pound per week Use MyFitnessPal app to log daily intake of food, drink and exercise.  Make snacks high in protein (>10g) and low in carbs (<15g). Stay away from high sugar drinks and foods.  Consider getting a Education officer, museumit Bit or Garmin to track daily steps.  Aim for 5,000 steps per day. Don't be discouraged by step counts though. Start somewhere! Increase how much you walk by parking further away from store entrances, taking the stairs, or finding a walking buddy.  Stay active! Try to work out 3-4 days per week for 30-45 minutes. Aim for 64 oz of water each day. Work on stress reduction, meal planning and 8 hours of sleep at night. Don't skip meals. Can substitute a protein drink for one meal per day.

## 2018-02-15 ENCOUNTER — Telehealth: Payer: Self-pay | Admitting: Physician Assistant

## 2018-02-15 ENCOUNTER — Encounter: Payer: Self-pay | Admitting: Physician Assistant

## 2018-02-15 LAB — CBC WITH DIFFERENTIAL/PLATELET
BASOS ABS: 105 {cells}/uL (ref 0–200)
Basophils Relative: 0.9 %
EOS PCT: 1.4 %
Eosinophils Absolute: 164 cells/uL (ref 15–500)
HCT: 41.3 % (ref 35.0–45.0)
Hemoglobin: 14 g/dL (ref 11.7–15.5)
Lymphs Abs: 3955 cells/uL — ABNORMAL HIGH (ref 850–3900)
MCH: 30.3 pg (ref 27.0–33.0)
MCHC: 33.9 g/dL (ref 32.0–36.0)
MCV: 89.4 fL (ref 80.0–100.0)
MONOS PCT: 7 %
MPV: 9.4 fL (ref 7.5–12.5)
NEUTROS ABS: 6657 {cells}/uL (ref 1500–7800)
Neutrophils Relative %: 56.9 %
PLATELETS: 368 10*3/uL (ref 140–400)
RBC: 4.62 10*6/uL (ref 3.80–5.10)
RDW: 12.3 % (ref 11.0–15.0)
TOTAL LYMPHOCYTE: 33.8 %
WBC mixed population: 819 cells/uL (ref 200–950)
WBC: 11.7 10*3/uL — ABNORMAL HIGH (ref 3.8–10.8)

## 2018-02-15 LAB — COMPREHENSIVE METABOLIC PANEL
AG RATIO: 1.7 (calc) (ref 1.0–2.5)
ALT: 19 U/L (ref 6–29)
AST: 17 U/L (ref 10–30)
Albumin: 4.6 g/dL (ref 3.6–5.1)
Alkaline phosphatase (APISO): 69 U/L (ref 33–115)
BUN: 13 mg/dL (ref 7–25)
CO2: 29 mmol/L (ref 20–32)
Calcium: 10 mg/dL (ref 8.6–10.2)
Chloride: 101 mmol/L (ref 98–110)
Creat: 0.79 mg/dL (ref 0.50–1.10)
Globulin: 2.7 g/dL (calc) (ref 1.9–3.7)
Glucose, Bld: 94 mg/dL (ref 65–99)
Potassium: 4.5 mmol/L (ref 3.5–5.3)
SODIUM: 138 mmol/L (ref 135–146)
TOTAL PROTEIN: 7.3 g/dL (ref 6.1–8.1)
Total Bilirubin: 0.4 mg/dL (ref 0.2–1.2)

## 2018-02-15 LAB — TEST AUTHORIZATION

## 2018-02-15 LAB — PATHOLOGIST SMEAR REVIEW

## 2018-02-15 LAB — IRON,TIBC AND FERRITIN PANEL
%SAT: 19 % (ref 16–45)
FERRITIN: 44 ng/mL (ref 16–154)
Iron: 76 ug/dL (ref 40–190)
TIBC: 396 ug/dL (ref 250–450)

## 2018-02-15 NOTE — Telephone Encounter (Signed)
Received fax from [employer] that Ozepmic  requires a PA. Information has been sent to the insurance company. Awaiting determination.

## 2018-02-17 ENCOUNTER — Encounter: Payer: Self-pay | Admitting: Physician Assistant

## 2018-02-17 DIAGNOSIS — D72820 Lymphocytosis (symptomatic): Secondary | ICD-10-CM | POA: Insufficient documentation

## 2018-02-18 NOTE — Telephone Encounter (Signed)
Received a fax from [employer] that Ozempic did not meet medical necessity since the patient is not diagnosed with diabetes. This plan will not cover for weight loss even with PA. Please advise.

## 2018-02-21 NOTE — Telephone Encounter (Signed)
MyChart message sent to patient with PCP recommendations. Waiting on response.

## 2018-02-21 NOTE — Telephone Encounter (Signed)
Please let the patient know this will not be approved by insurance We can prescribe her an alternative medication Would recommend she contact her insurance regarding what is on formulary for weight loss medication Specifically she can ask about Saxenda, Contrave, Belviq, Qsymia and Orlistat Have her send me a MyChart message once she has done this

## 2018-02-21 NOTE — Telephone Encounter (Signed)
Patient has responded and will call back to let our office know what medications is covered for her weight loss.

## 2018-03-03 ENCOUNTER — Encounter: Payer: Self-pay | Admitting: Physician Assistant

## 2018-03-10 ENCOUNTER — Encounter: Payer: Self-pay | Admitting: Physician Assistant

## 2018-03-10 ENCOUNTER — Ambulatory Visit (INDEPENDENT_AMBULATORY_CARE_PROVIDER_SITE_OTHER): Payer: BLUE CROSS/BLUE SHIELD | Admitting: Physician Assistant

## 2018-03-10 VITALS — BP 146/102 | HR 109 | Wt 256.0 lb

## 2018-03-10 DIAGNOSIS — R079 Chest pain, unspecified: Secondary | ICD-10-CM

## 2018-03-10 DIAGNOSIS — Z6841 Body Mass Index (BMI) 40.0 and over, adult: Secondary | ICD-10-CM

## 2018-03-10 DIAGNOSIS — I10 Essential (primary) hypertension: Secondary | ICD-10-CM | POA: Diagnosis not present

## 2018-03-10 DIAGNOSIS — F418 Other specified anxiety disorders: Secondary | ICD-10-CM | POA: Diagnosis not present

## 2018-03-10 DIAGNOSIS — F39 Unspecified mood [affective] disorder: Secondary | ICD-10-CM | POA: Diagnosis not present

## 2018-03-10 DIAGNOSIS — R000 Tachycardia, unspecified: Secondary | ICD-10-CM

## 2018-03-10 MED ORDER — PROPRANOLOL HCL ER 80 MG PO CP24: 80.0000 mg | ORAL_CAPSULE | Freq: Every day | ORAL | 0 refills | Status: DC

## 2018-03-10 MED ORDER — IRBESARTAN 75 MG PO TABS: 75.0000 mg | ORAL_TABLET | Freq: Every day | ORAL | 0 refills | Status: DC

## 2018-03-10 MED ORDER — METFORMIN HCL ER 500 MG PO TB24: 500.0000 mg | ORAL_TABLET | Freq: Every day | ORAL | 0 refills | Status: DC

## 2018-03-10 NOTE — Patient Instructions (Addendum)
For your blood pressure: - Goal <130/80 (Ideally 120's/70's) Stop amlodipine Start propranolol.  This is a beta-blocker that will reduce your heart rate Also start irbesartan.  This is a angiotensin receptor blocker that will lower your blood pressure.  Start with 1 tablet daily for a week.  Monitor your blood pressures at home.  If you are not at goal after 1 week increase to 2 tablets daily - take your blood pressure medication in the morning (unless instructed differently)  - monitor and log blood pressures at home - check around the same time each day in a relaxed setting - Limit salt to <2500 mg/day - Follow DASH (Dietary Approach to Stopping Hypertension) eating plan - Try to get at least 150 minutes of aerobic exercise per week - Aim to go on a brisk walk 30 minutes per day at least 5 days per week. If you're not active, gradually increase how long you walk by 5 minutes each week - limit alcohol: 2 standard drinks per day for men and 1 per day for women - avoid tobacco/nicotine products. Consider smoking cessation if you smoke - weight loss: 7% of current body weight can reduce your blood pressure by 5-10 points - follow-up at least every 6 months for your blood pressure. Follow-up sooner if your BP is not controlled  For weight loss: Start Metformin 1 tablet with a meal each day (doesn't matter which meal).  Most common side effect of this medication is GI upset, including nausea and diarrhea.  Starting at a low dose with a meal can help prevent this.  I also recommend taking with evening meal to reduce risk of nausea. If after 2 weeks you are tolerating your current dose of metformin you can increase to 2 tablets daily with a meal  1700 calories/day to lose 1 pound per week  Or 1900 calories/day to lose 1/2 pound per week Use MyFitnessPal app to log daily intake of food, drink and exercise.  Make snacks high in protein (>10g) and low in carbs (<15g). Stay away from high sugar  drinks and foods.  Consider getting a Education officer, [employer]it Bit or Garmin to track daily steps.  Aim for 5,000 steps per day. Don't be discouraged by step counts though. Start somewhere! Increase how much you walk by parking further away from store entrances, taking the stairs, or finding a walking buddy.  Stay active! Try to work out 3-4 days per week for 30-45 minutes. Aim for 64 oz of water each day. Work on stress reduction, meal planning and 8 hours of sleep at night. Don't skip meals. Can substitute a protein drink for one meal per day.

## 2018-03-10 NOTE — Progress Notes (Signed)
HPI:                                                                Debra Andrade is a 37 y.o. female who presents to [facility]: Primary Care Sports Medicine today for hypertension follow-up  HTN: taking Amldoipine 5 mg daily. Compliant with medications. Checks BP's at home. BP range 141-151/91-97. Denies vision change, headache, orthopnea, lightheadedness, syncope and edema.  Intermittent chest discomfort associated with emotional stress and increased exertion. Onset about 3 weeks ago. Pain is retrosternal, described as squeezing and pressing. Lasting for several minutes to hours. Resolves with rest. Occurs most days. Reports pain was worse with increased dose of Amlodipine 10 mg. Risk factors include: obesity, HLD  States she feels stressed a lot of the time. She has a history of bipolar depression, anxiety, and OCD (prior meds: latuda, lamictal, luvox). She has been off her medication for about 1 year, self-tapered because she was doing well. She was followed by [facility]. She denies depressed mood or manic symptoms. Mainly endorses excessive worry and anxiety most days. Associated with heart palpitations and chest discomfort.    Past Medical History:  Diagnosis Date  . Anxiety   . Bipolar affective (HCC)   . Depression   . GERD (gastroesophageal reflux disease)   . Obesity    Past Surgical History:  Procedure Laterality Date  . WISDOM TOOTH EXTRACTION     Social History   Tobacco Use  . Smoking status: Never Smoker  . Smokeless tobacco: Never Used  Substance Use Topics  . Alcohol use: Yes    Alcohol/week: 1.0 standard drinks    Types: 1 Standard drinks or equivalent per week   family history includes Barrett's esophagus in her father; Bipolar disorder in her brother; Esophageal cancer in her father; Hyperlipidemia in her father; Hypertension in her father; Kidney disease in her mother.    ROS: negative except as noted in the  HPI  Medications: Current Outpatient Medications  Medication Sig Dispense Refill  . cetirizine (ZYRTEC) 10 MG tablet Take 10 mg by mouth daily.    . Cranberry 360 MG CAPS Take by mouth.    . cyanocobalamin 500 MCG tablet Take 500 mcg by mouth daily.    Marland Kitchen omeprazole (PRILOSEC) 20 MG capsule Take 20 mg by mouth daily.    . potassium citrate (UROCIT-K) 10 MEQ (1080 MG) SR tablet Take 10 mEq by mouth 3 (three) times daily with meals.    . irbesartan (AVAPRO) 75 MG tablet Take 1 tablet (75 mg total) by mouth daily. Self-titrate to goal BP <130/80, max dose 150 mg QD 90 tablet 0  . metFORMIN (GLUCOPHAGE XR) 500 MG 24 hr tablet Take 1 tablet (500 mg total) by mouth daily with supper. 90 tablet 0  . propranolol ER (INDERAL LA) 80 MG 24 hr capsule Take 1 capsule (80 mg total) by mouth daily. 90 capsule 0   No current facility-administered medications for this visit.    Allergies  Allergen Reactions  . Amoxicillin Rash       Objective:  BP (!) 146/102   Pulse (!) 109   Wt 256 lb (116.1 kg)   BMI 40.10 kg/m  Gen:  alert, not ill-appearing, no distress, appropriate for age,  obese female HEENT: head normocephalic without obvious abnormality, conjunctiva and cornea clear, trachea midline Pulm: Normal work of breathing, normal phonation, clear to auscultation bilaterally, no wheezes, rales or rhonchi CV: tachycardic rate, regular rhythm, s1 and s2 distinct, no murmurs, clicks or rubs  Neuro: alert and oriented x 3, no tremor MSK: extremities atraumatic, normal gait and station Skin: intact, no rashes on exposed skin, no jaundice, no cyanosis Psych: well-groomed, cooperative, good eye contact, "anxious" mood, affect mood-congruent, speech is articulate, and thought processes clear and goal-directed  Lab Results  Component Value Date   CREATININE 0.79 02/10/2018   BUN 13 02/10/2018   NA 138 02/10/2018   K 4.5 02/10/2018   CL 101 02/10/2018   CO2 29 02/10/2018   Lab Results  Component  Value Date   CHOL 210 (H) 05/31/2017   HDL 56 05/31/2017   LDLCALC 130 (H) 05/31/2017   TRIG 127 05/31/2017   CHOLHDL 3.8 05/31/2017    ECG 78/29/562112/02/2018 16:47 Vent rate 103 bpm PR-I 138 ms QRS 88 ms QT/QTc 344/450 ms Sinus tachycardia  No results found for this or any previous visit (from the past 72 hour(s)). No results found.    Assessment and Plan: 37 y.o. female with   .Debra Poagmelia was seen today for medication management.  Diagnoses and all orders for this visit:  Other specified anxiety disorders -     Ambulatory referral to Psychiatry  Mood disorder [facility](HCC) -     Ambulatory referral to Psychiatry  Hypertension goal BP (blood pressure) < 130/80 -     propranolol ER (INDERAL LA) 80 MG 24 hr capsule; Take 1 capsule (80 mg total) by mouth daily. -     irbesartan (AVAPRO) 75 MG tablet; Take 1 tablet (75 mg total) by mouth daily. Self-titrate to goal BP <130/80, max dose 150 mg QD  Tachycardia with heart rate 100-120 beats per minute -     propranolol ER (INDERAL LA) 80 MG 24 hr capsule; Take 1 capsule (80 mg total) by mouth daily. -     EKG 12-Lead  Class 3 severe obesity due to excess calories with serious comorbidity and body mass index (BMI) of 40.0 to 44.9 in adult (HCC) -     metFORMIN (GLUCOPHAGE XR) 500 MG 24 hr tablet; Take 1 tablet (500 mg total) by mouth daily with supper.  Chest pain, unspecified type -     Exercise Tolerance Test; Future   HTN, Tachycardia BP Readings from Last 3 Encounters:  03/10/18 (!) 146/102  02/10/18 (!) 148/105  01/13/18 (!) 148/95  - poor control with Amlodipine monotherapy. Increased chest discomfort with 10 mg dose. Switching to Irbesartan. Adding Propranolol for co-morbid tachycardia and anxiety. - counseled on therapeutic lifestyle changes - cont to monitor BP as well as HR at home   Chest pain - ECG personally reviewed by me, sinus tachycardia, no ST abnormalities - starting beta blocker - exercise stress test to assess  for ischemia  Anxiety, Mood disorder - referral placed to Psychiatry - declined trial of Buspar, reports this was not effective in the past  Obesity BMI 40 with comorbidity Insurance unfortunately does not cover weight loss medications. Ozempic was denied. Avoiding Phentermine and Wellbutrin due to uncontrolled HTN and anxiety Starting Metformin self-titration Counseled on weight loss through 1900 calorie restricted diet Not cleared to exercise pending stress test   Patient education and anticipatory guidance given Patient agrees with treatment plan Follow-up in 1 month for nurse BP and weight check or  sooner as needed if symptoms worsen or fail to improve  Darlyne Russian PA-C

## 2018-03-13 ENCOUNTER — Encounter: Payer: Self-pay | Admitting: Physician Assistant

## 2018-03-13 DIAGNOSIS — R000 Tachycardia, unspecified: Secondary | ICD-10-CM | POA: Insufficient documentation

## 2018-03-13 DIAGNOSIS — I10 Essential (primary) hypertension: Secondary | ICD-10-CM | POA: Insufficient documentation

## 2018-03-13 DIAGNOSIS — R079 Chest pain, unspecified: Secondary | ICD-10-CM | POA: Insufficient documentation

## 2018-03-13 DIAGNOSIS — F39 Unspecified mood [affective] disorder: Secondary | ICD-10-CM | POA: Insufficient documentation

## 2018-04-07 ENCOUNTER — Ambulatory Visit (INDEPENDENT_AMBULATORY_CARE_PROVIDER_SITE_OTHER): Payer: BLUE CROSS/BLUE SHIELD | Admitting: Sports Medicine

## 2018-04-07 VITALS — BP 122/83 | HR 92 | Wt 253.0 lb

## 2018-04-07 DIAGNOSIS — I10 Essential (primary) hypertension: Secondary | ICD-10-CM

## 2018-04-07 NOTE — Progress Notes (Signed)
Jamayra presents to the clinic weight and BP check.  Per Vinetta Bergamo notes pt was to increase irbesartan if BP was not at goal. Today's BP was 146/99 first reading and second reading was 122/83.  Also she is taking metformin for weight lose.  She has lost 3 Lbs.  Per Vinetta Bergamo notes she was to double up if she was tolerating medication.  Pt reports having nausea the first 2 days after starting medication but have since subsided.  I advised pt to taking both irbesartan and metformin BID.  Per Dr. Karie Schwalbe she is to come back in 2 weeks for BP check. -EH/RMA

## 2018-04-21 ENCOUNTER — Ambulatory Visit (INDEPENDENT_AMBULATORY_CARE_PROVIDER_SITE_OTHER): Payer: BLUE CROSS/BLUE SHIELD | Admitting: Physician Assistant

## 2018-04-21 DIAGNOSIS — Z6841 Body Mass Index (BMI) 40.0 and over, adult: Secondary | ICD-10-CM

## 2018-04-21 DIAGNOSIS — I10 Essential (primary) hypertension: Secondary | ICD-10-CM | POA: Diagnosis not present

## 2018-04-21 NOTE — Progress Notes (Signed)
Pt came into clinic today for BP check. States at last OV she was advised to increase her irbesartan 75mg  to 2 tabs daily, and her metformin 500mg  to 2 tabs daily. She reports taking 2 tabs of irbesartan 75mg , 1 tab of propranolol in the AM and she takes the 2 tabs of metformin 500mg  with her largest meal of the day. Reports no negative side effects.   Vitals:   04/21/18 1342  BP: 129/76  Pulse: 78     BP at goal today. She reports she has not been checking it at home as often as she should, she will work on this.   Questions when she should follow up, will route. Rx's with new directions pended for PCP approval.

## 2018-04-22 ENCOUNTER — Ambulatory Visit (INDEPENDENT_AMBULATORY_CARE_PROVIDER_SITE_OTHER): Payer: BLUE CROSS/BLUE SHIELD

## 2018-04-22 DIAGNOSIS — F410 Panic disorder [episodic paroxysmal anxiety] without agoraphobia: Secondary | ICD-10-CM | POA: Diagnosis not present

## 2018-04-22 DIAGNOSIS — R079 Chest pain, unspecified: Secondary | ICD-10-CM | POA: Diagnosis not present

## 2018-04-22 LAB — EXERCISE TOLERANCE TEST
CSEPEDS: 44 s
CSEPEW: 7 METS
CSEPHR: 92 %
CSEPPHR: 169 {beats}/min
Exercise duration (min): 5 min
MPHR: 182 {beats}/min
RPE: 17
Rest HR: 92 {beats}/min

## 2018-04-25 ENCOUNTER — Encounter: Payer: Self-pay | Admitting: Physician Assistant

## 2018-04-25 MED ORDER — IRBESARTAN 150 MG PO TABS: 150.0000 mg | ORAL_TABLET | Freq: Every day | ORAL | 1 refills | Status: DC

## 2018-04-25 MED ORDER — METFORMIN HCL ER 500 MG PO TB24: 1000.0000 mg | ORAL_TABLET | Freq: Every day | ORAL | 1 refills | Status: DC

## 2018-04-29 ENCOUNTER — Encounter: Payer: Self-pay | Admitting: Physician Assistant

## 2018-04-29 ENCOUNTER — Other Ambulatory Visit: Payer: Self-pay | Admitting: Physician Assistant

## 2018-04-29 DIAGNOSIS — I10 Essential (primary) hypertension: Secondary | ICD-10-CM

## 2018-04-29 MED ORDER — IRBESARTAN 300 MG PO TABS
300.0000 mg | ORAL_TABLET | Freq: Every day | ORAL | 1 refills | Status: DC
Start: 2018-04-29 — End: 2018-09-08

## 2018-05-20 DIAGNOSIS — F411 Generalized anxiety disorder: Secondary | ICD-10-CM | POA: Diagnosis not present

## 2018-05-23 ENCOUNTER — Encounter: Payer: Self-pay | Admitting: Emergency Medicine

## 2018-05-23 ENCOUNTER — Emergency Department (INDEPENDENT_AMBULATORY_CARE_PROVIDER_SITE_OTHER)
Admission: EM | Admit: 2018-05-23 | Discharge: 2018-05-23 | Disposition: A | Discharge status: Home / Self Care | Payer: BLUE CROSS/BLUE SHIELD | Source: Home / Self Care | Attending: Family Medicine | Admitting: Family Medicine

## 2018-05-23 ENCOUNTER — Other Ambulatory Visit: Payer: Self-pay

## 2018-05-23 DIAGNOSIS — R3915 Urgency of urination: Secondary | ICD-10-CM

## 2018-05-23 LAB — POCT URINALYSIS DIP (MANUAL ENTRY)
BILIRUBIN UA: NEGATIVE mg/dL
Bilirubin, UA: NEGATIVE
Glucose, UA: NEGATIVE mg/dL
LEUKOCYTES UA: NEGATIVE
Nitrite, UA: NEGATIVE
PH UA: 6 (ref 5.0–8.0)
Protein Ur, POC: NEGATIVE mg/dL
Spec Grav, UA: 1.02 (ref 1.010–1.025)
Urobilinogen, UA: 0.2 E.U./dL

## 2018-05-23 NOTE — ED Triage Notes (Signed)
Patient reports having sense of urgency and frequency intermittently for past 3 weeks; has used [facility] twice and had course of Bactrim and most recently Macrobid, which she finished today. Not worse, but not clear; desires UA and culture with sensitivity.

## 2018-05-23 NOTE — Discharge Instructions (Addendum)
Continue increased fluid intake. Recommend follow-up with urologist if urine culture is negative and urinary symptoms persist.

## 2018-05-23 NOTE — ED Provider Notes (Signed)
[facility]    CSN: [id number] Arrival date & time: 05/23/18  1742     History   Chief Complaint Chief Complaint  Patient presents with  . Urinary Frequency  . Urinary Urgency    HPI CEDRA ETSITTY is a 38 y.o. female.   Patient reports a three week history of intermittent urgency and frequency without dysuria.  She denies abdominal, pelvic or back pain and no fevers, chills, and sweats.  She consulted a Teledoc twice, initially receiving Bactrim without improvement and then a course of Macrobid without improvement.  The history is provided by the patient.  Urinary Frequency  This is a new problem. Episode onset: 3 weeks ago. The problem occurs daily. The problem has not changed since onset.Pertinent negatives include no abdominal pain. Nothing aggravates the symptoms. Nothing relieves the symptoms. Treatments tried: antibiotics. The treatment provided no relief.    Past Medical History:  Diagnosis Date  . Anxiety   . Bipolar affective (HCC)   . Depression   . GERD (gastroesophageal reflux disease)   . Obesity     Patient Active Problem List   Diagnosis Date Noted  . Chest pain 03/13/2018  . Tachycardia with heart rate 100-120 beats per minute 03/13/2018  . Hypertension goal BP (blood pressure) < 130/80 03/13/2018  . Mood disorder (HCC) 03/13/2018  . Persistent lymphocytosis 02/17/2018  . Uncontrolled hypertension 02/10/2018  . Class 3 severe obesity due to excess calories with serious comorbidity and body mass index (BMI) of 40.0 to 44.9 in adult (HCC) 02/10/2018  . Irregular menses 02/10/2018  . Fatigue 02/10/2018  . Elevated blood pressure reading 05/31/2017  . Loose stools 05/31/2017  . Anxiety disorder 05/31/2017  . Gastroesophageal reflux disease 05/31/2017    Past Surgical History:  Procedure Laterality Date  . WISDOM TOOTH EXTRACTION      OB History    Gravida  1   Para  0   Term      Preterm      AB  1   Living        SAB     TAB      Ectopic      Multiple      Live Births               Home Medications    Prior to Admission medications   Medication Sig Start Date End Date Taking? Authorizing Provider  FLUoxetine (PROZAC) 40 MG capsule Take 40 mg by mouth daily.   Yes [provider]  cetirizine (ZYRTEC) 10 MG tablet Take 10 mg by mouth daily.    [provider]  Cranberry 360 MG CAPS Take by mouth.    [provider]  cyanocobalamin 500 MCG tablet Take 500 mcg by mouth daily.    [provider]  irbesartan (AVAPRO) 300 MG tablet Take 1 tablet (300 mg total) by mouth daily. 04/29/18   Carlis Stable, PA-C  metFORMIN (GLUCOPHAGE XR) 500 MG 24 hr tablet Take 2 tablets (1,000 mg total) by mouth daily with supper. 04/25/18   Carlis Stable, PA-C  omeprazole (PRILOSEC) 20 MG capsule Take 20 mg by mouth daily.    [provider]  potassium citrate (UROCIT-K) 10 MEQ (1080 MG) SR tablet Take 10 mEq by mouth 3 (three) times daily with meals.    [provider]  propranolol ER (INDERAL LA) 80 MG 24 hr capsule Take 1 capsule (80 mg total) by mouth daily. 03/10/18  Carlis Stable, PA-C    Family History Family History  Problem Relation Age of Onset  . Kidney disease Mother   . Barrett's esophagus Father   . Esophageal cancer Father   . Hypertension Father   . Hyperlipidemia Father   . Bipolar disorder Brother     Social History Social History   Tobacco Use  . Smoking status: Never Smoker  . Smokeless tobacco: Never Used  Substance Use Topics  . Alcohol use: Yes    Alcohol/week: 1.0 standard drinks    Types: 1 Standard drinks or equivalent per week  . Drug use: No     Allergies   Amoxicillin   Review of Systems Review of Systems  Constitutional: Negative for activity change, chills, diaphoresis, fatigue and fever.  Gastrointestinal: Negative for abdominal pain.  Genitourinary: Positive for  frequency and urgency. Negative for decreased urine volume, difficulty urinating, dysuria, flank pain, genital sores, hematuria, menstrual problem, pelvic pain, vaginal bleeding, vaginal discharge and vaginal pain.  All other systems reviewed and are negative.    Physical Exam Triage Vital Signs ED Triage Vitals  Enc Vitals Group     BP 05/23/18 1814 124/84     Pulse Rate 05/23/18 1814 77     Resp 05/23/18 1814 16     Temp 05/23/18 1814 98 F (36.7 C)     Temp Source 05/23/18 1814 Oral     SpO2 05/23/18 1814 96 %     Weight 05/23/18 1815 250 lb (113.4 kg)     Height 05/23/18 1815 5\' 7"  (1.702 m)     Head Circumference --      Peak Flow --      Pain Score 05/23/18 1815 0     Pain Loc --      Pain Edu? --      Excl. in GC? --    No data found.  Updated Vital Signs BP 124/84 (BP Location: Right Arm)   Pulse 77   Temp 98 F (36.7 C) (Oral)   Resp 16   Ht 5\' 7"  (1.702 m)   Wt 113.4 kg   LMP 05/15/2018 (Exact Date)   SpO2 96%   BMI 39.16 kg/m   Visual Acuity Right Eye Distance:   Left Eye Distance:   Bilateral Distance:    Right Eye Near:   Left Eye Near:    Bilateral Near:     Physical Exam Nursing notes and Vital Signs reviewed. Appearance:  Patient appears stated age, and in no acute distress.    Eyes:  Pupils are equal, round, and reactive to light and accomodation.  Extraocular movement is intact.  Conjunctivae are not inflamed   Pharynx:  Normal; moist mucous membranes  Neck:  Supple.  No adenopathy Lungs:  Clear to auscultation.  Breath sounds are equal.  Moving air well. Heart:  Regular rate and rhythm without murmurs, rubs, or gallops.  Abdomen:  Nontender without masses or hepatosplenomegaly.  Bowel sounds are present.  No CVA or flank tenderness.  Extremities:  No edema.  Skin:  No rash present.     UC Treatments / Results  Labs (all labs ordered are listed, but only abnormal results are displayed) Labs Reviewed  POCT URINALYSIS DIP (MANUAL ENTRY)  - Abnormal; Notable for the following components:      Result Value   Blood, UA trace-intact (*)    All other components within normal limits  URINE CULTURE    EKG None  Radiology No results found.  Procedures Procedures (including critical care time)  Medications Ordered in UC Medications - No data to display  Initial Impression / Assessment and Plan / UC Course  I have reviewed the triage vital signs and the nursing notes.  Pertinent labs & imaging results that were available during my care of the patient were reviewed by me and considered in my medical decision making (see chart for details).    Urine culture pending.  Will begin appropriate antibiotic if urine culture positive   Final Clinical Impressions(s) / UC Diagnoses   Final diagnoses:  Urinary urgency     Discharge Instructions     Continue increased fluid intake. Recommend follow-up with urologist if urine culture is negative and urinary symptoms persist.    ED Prescriptions    None        Lattie Haw, MD 05/26/18 1941

## 2018-05-24 LAB — URINE CULTURE
MICRO NUMBER: 234790
SPECIMEN QUALITY: ADEQUATE

## 2018-05-25 ENCOUNTER — Telehealth: Payer: Self-pay | Admitting: *Deleted

## 2018-05-25 NOTE — Telephone Encounter (Signed)
LM with Ucx results and to call back if she has any questions or concerns. If still symptomatic she should f/u with her PCP or GYN for further evaluation.

## 2018-06-03 ENCOUNTER — Other Ambulatory Visit: Payer: Self-pay | Admitting: Physician Assistant

## 2018-06-03 DIAGNOSIS — R000 Tachycardia, unspecified: Secondary | ICD-10-CM

## 2018-06-03 DIAGNOSIS — I10 Essential (primary) hypertension: Secondary | ICD-10-CM

## 2018-07-28 DIAGNOSIS — F411 Generalized anxiety disorder: Secondary | ICD-10-CM | POA: Diagnosis not present

## 2018-08-31 ENCOUNTER — Encounter: Payer: Self-pay | Admitting: Physician Assistant

## 2018-09-02 ENCOUNTER — Ambulatory Visit: Payer: BLUE CROSS/BLUE SHIELD | Admitting: Physician Assistant

## 2018-09-08 ENCOUNTER — Encounter: Payer: Self-pay | Admitting: Physician Assistant

## 2018-09-08 ENCOUNTER — Ambulatory Visit (INDEPENDENT_AMBULATORY_CARE_PROVIDER_SITE_OTHER): Payer: BLUE CROSS/BLUE SHIELD | Admitting: Physician Assistant

## 2018-09-08 VITALS — BP 141/87 | HR 85 | Temp 98.4°F | Wt 262.0 lb

## 2018-09-08 DIAGNOSIS — Z9289 Personal history of other medical treatment: Secondary | ICD-10-CM

## 2018-09-08 DIAGNOSIS — Z5181 Encounter for therapeutic drug level monitoring: Secondary | ICD-10-CM

## 2018-09-08 DIAGNOSIS — R000 Tachycardia, unspecified: Secondary | ICD-10-CM | POA: Diagnosis not present

## 2018-09-08 DIAGNOSIS — I10 Essential (primary) hypertension: Secondary | ICD-10-CM

## 2018-09-08 DIAGNOSIS — D72820 Lymphocytosis (symptomatic): Secondary | ICD-10-CM

## 2018-09-08 HISTORY — DX: Personal history of other medical treatment: Z92.89

## 2018-09-08 MED ORDER — IRBESARTAN 300 MG PO TABS: 300.0000 mg | ORAL_TABLET | Freq: Every day | ORAL | 1 refills | Status: DC

## 2018-09-08 MED ORDER — PROPRANOLOL HCL ER 80 MG PO CP24: 80.0000 mg | ORAL_CAPSULE | Freq: Every day | ORAL | 1 refills | Status: DC

## 2018-09-08 NOTE — Patient Instructions (Addendum)
For your blood pressure: - Goal <130/80 (Ideally 120's/70's) - take your blood pressure medication in the morning (unless instructed differently) - monitor and log blood pressures at home - check around the same time each day in a relaxed setting - Limit salt to <2500 mg/day - Follow DASH (Dietary Approach to Stopping Hypertension) eating plan - Try to get at least 150 minutes of aerobic exercise per week - Aim to go on a brisk walk 30 minutes per day at least 5 days per week. If you're not active, gradually increase how long you walk by 5 minutes each week - limit alcohol: 2 standard drinks per day for men and 1 per day for women - avoid tobacco/nicotine products. Consider smoking cessation if you smoke - weight loss: 7% of current body weight can reduce your blood pressure by 5-10 points - follow-up at least every 6 months for your blood pressure. Follow-up sooner if your BP is not controlled    DASH Eating Plan DASH stands for "Dietary Approaches to Stop Hypertension." The DASH eating plan is a healthy eating plan that has been shown to reduce high blood pressure (hypertension). It may also reduce your risk for type 2 diabetes, heart disease, and stroke. The DASH eating plan may also help with weight loss. What are tips for following this plan?  General guidelines  Avoid eating more than 2,300 mg (milligrams) of salt (sodium) a day. If you have hypertension, you may need to reduce your sodium intake to 1,500 mg a day.  Limit alcohol intake to no more than 1 drink a day for nonpregnant women and 2 drinks a day for men. One drink equals 12 oz of beer, 5 oz of wine, or 1 oz of hard liquor.  Work with your health care provider to maintain a healthy body weight or to lose weight. Ask what an ideal weight is for you.  Get at least 30 minutes of exercise that causes your heart to beat faster (aerobic exercise) most days of the week. Activities may include walking, swimming, or  biking.  Work with your health care provider or diet and nutrition specialist (dietitian) to adjust your eating plan to your individual calorie needs. Reading food labels   Check food labels for the amount of sodium per serving. Choose foods with less than 5 percent of the Daily Value of sodium. Generally, foods with less than 300 mg of sodium per serving fit into this eating plan.  To find whole grains, look for the word "whole" as the first word in the ingredient list. Shopping  Buy products labeled as "low-sodium" or "no salt added."  Buy fresh foods. Avoid canned foods and premade or frozen meals. Cooking  Avoid adding salt when cooking. Use salt-free seasonings or herbs instead of table salt or sea salt. Check with your health care provider or pharmacist before using salt substitutes.  Do not fry foods. Cook foods using healthy methods such as baking, boiling, grilling, and broiling instead.  Cook with heart-healthy oils, such as olive, canola, soybean, or sunflower oil. Meal planning  Eat a balanced diet that includes: ? 5 or more servings of fruits and vegetables each day. At each meal, try to fill half of your plate with fruits and vegetables. ? Up to 6-8 servings of whole grains each day. ? Less than 6 oz of lean meat, poultry, or fish each day. A 3-oz serving of meat is about the same size as a deck of cards. One egg equals   1 oz. ? 2 servings of low-fat dairy each day. ? A serving of nuts, seeds, or beans 5 times each week. ? Heart-healthy fats. Healthy fats called Omega-3 fatty acids are found in foods such as flaxseeds and coldwater fish, like sardines, salmon, and mackerel.  Limit how much you eat of the following: ? Canned or prepackaged foods. ? Food that is high in trans fat, such as fried foods. ? Food that is high in saturated fat, such as fatty meat. ? Sweets, desserts, sugary drinks, and other foods with added sugar. ? Full-fat dairy products.  Do not salt  foods before eating.  Try to eat at least 2 vegetarian meals each week.  Eat more home-cooked food and less restaurant, buffet, and fast food.  When eating at a restaurant, ask that your food be prepared with less salt or no salt, if possible. What foods are recommended? The items listed may not be a complete list. Talk with your dietitian about what dietary choices are best for you. Grains Whole-grain or whole-wheat bread. Whole-grain or whole-wheat pasta. Brown rice. Oatmeal. Quinoa. Bulgur. Whole-grain and low-sodium cereals. Pita bread. Low-fat, low-sodium crackers. Whole-wheat flour tortillas. Vegetables Fresh or frozen vegetables (raw, steamed, roasted, or grilled). Low-sodium or reduced-sodium tomato and vegetable juice. Low-sodium or reduced-sodium tomato sauce and tomato paste. Low-sodium or reduced-sodium canned vegetables. Fruits All fresh, dried, or frozen fruit. Canned fruit in natural juice (without added sugar). Meat and other protein foods Skinless chicken or turkey. Ground chicken or turkey. Pork with fat trimmed off. Fish and seafood. Egg whites. Dried beans, peas, or lentils. Unsalted nuts, nut butters, and seeds. Unsalted canned beans. Lean cuts of beef with fat trimmed off. Low-sodium, lean deli meat. Dairy Low-fat (1%) or fat-free (skim) milk. Fat-free, low-fat, or reduced-fat cheeses. Nonfat, low-sodium ricotta or cottage cheese. Low-fat or nonfat yogurt. Low-fat, low-sodium cheese. Fats and oils Soft margarine without trans fats. Vegetable oil. Low-fat, reduced-fat, or light mayonnaise and salad dressings (reduced-sodium). Canola, safflower, olive, soybean, and sunflower oils. Avocado. Seasoning and other foods Herbs. Spices. Seasoning mixes without salt. Unsalted popcorn and pretzels. Fat-free sweets. What foods are not recommended? The items listed may not be a complete list. Talk with your dietitian about what dietary choices are best for you. Grains Baked goods  made with fat, such as croissants, muffins, or some breads. Dry pasta or rice meal packs. Vegetables Creamed or fried vegetables. Vegetables in a cheese sauce. Regular canned vegetables (not low-sodium or reduced-sodium). Regular canned tomato sauce and paste (not low-sodium or reduced-sodium). Regular tomato and vegetable juice (not low-sodium or reduced-sodium). Pickles. Olives. Fruits Canned fruit in a light or heavy syrup. Fried fruit. Fruit in cream or butter sauce. Meat and other protein foods Fatty cuts of meat. Ribs. Fried meat. Bacon. Sausage. Bologna and other processed lunch meats. Salami. Fatback. Hotdogs. Bratwurst. Salted nuts and seeds. Canned beans with added salt. Canned or smoked fish. Whole eggs or egg yolks. Chicken or turkey with skin. Dairy Whole or 2% milk, cream, and half-and-half. Whole or full-fat cream cheese. Whole-fat or sweetened yogurt. Full-fat cheese. Nondairy creamers. Whipped toppings. Processed cheese and cheese spreads. Fats and oils Butter. Stick margarine. Lard. Shortening. Ghee. Bacon fat. Tropical oils, such as coconut, palm kernel, or palm oil. Seasoning and other foods Salted popcorn and pretzels. Onion salt, garlic salt, seasoned salt, table salt, and sea salt. Worcestershire sauce. Tartar sauce. Barbecue sauce. Teriyaki sauce. Soy sauce, including reduced-sodium. Steak sauce. Canned and packaged gravies. Fish sauce. Oyster   sauce. Cocktail sauce. Horseradish that you find on the shelf. Ketchup. Mustard. Meat flavorings and tenderizers. Bouillon cubes. Hot sauce and Tabasco sauce. Premade or packaged marinades. Premade or packaged taco seasonings. Relishes. Regular salad dressings. Where to find more information:  National Heart, Lung, and Blood Institute: www.nhlbi.nih.gov  American Heart Association: www.heart.org Summary  The DASH eating plan is a healthy eating plan that has been shown to reduce high blood pressure (hypertension). It may also reduce  your risk for type 2 diabetes, heart disease, and stroke.  With the DASH eating plan, you should limit salt (sodium) intake to 2,300 mg a day. If you have hypertension, you may need to reduce your sodium intake to 1,500 mg a day.  When on the DASH eating plan, aim to eat more fresh fruits and vegetables, whole grains, lean proteins, low-fat dairy, and heart-healthy fats.  Work with your health care provider or diet and nutrition specialist (dietitian) to adjust your eating plan to your individual calorie needs. This information is not intended to replace advice given to you by your health care provider. Make sure you discuss any questions you have with your health care provider. Document Released: 03/05/2011 Document Revised: 03/09/2016 Document Reviewed: 03/09/2016 Elsevier Interactive Patient Education  2019 Elsevier Inc.   

## 2018-09-08 NOTE — Progress Notes (Signed)
HPI:                                                                Debra Andrade is a 38 y.o. female who [facility] today for HTN follow-up/medication management  HTN: taking Irbesartan 300 mg and Propranolol ER 80 mg daily. Compliant with medications. Checks BP's at home. BP range low 130's/80's. Denies vision change, headache, orthopnea, lightheadedness, syncope and edema.  She underwent a stress test on 04/22/18, which showed hypertensive response to exercise. She has not had any recurrent chest discomfort recently, except when she was taking higher dose of Prozac 60 mg. Risk factors include: obesity, HLD  Weight: She self-discontinued Metformin due to intolerable diarrhea waking her from sleep. She has gained some weight during the Coronavirus pandemic but is planning to start walking at the park again  Anxiety: followed by Dr. Rene KocherEksir. Taking Prozac 40 mg QD.  Persistent lymphocytosis: denies constitutional symptoms.  Last CBC possibly hemolyzed.  Past Medical History:  Diagnosis Date  . Anxiety   . Bipolar affective (HCC)   . Depression   . GERD (gastroesophageal reflux disease)   . Obesity    Past Surgical History:  Procedure Laterality Date  . WISDOM TOOTH EXTRACTION     Social History   Tobacco Use  . Smoking status: Never Smoker  . Smokeless tobacco: Never Used  Substance Use Topics  . Alcohol use: Yes    Alcohol/week: 1.0 standard drinks    Types: 1 Standard drinks or equivalent per week   family history includes Barrett's esophagus in her father; Bipolar disorder in her brother; Esophageal cancer in her father; Hyperlipidemia in her father; Hypertension in her father; Kidney disease in her mother.    ROS: negative except as noted in the HPI  Medications: Current Outpatient Medications  Medication Sig Dispense Refill  . cetirizine (ZYRTEC) 10 MG tablet Take 10 mg by mouth daily.    . Cranberry 360  MG CAPS Take by mouth.    . cyanocobalamin 500 MCG tablet Take 500 mcg by mouth daily.    Marland Kitchen. FLUoxetine (PROZAC) 40 MG capsule Take 40 mg by mouth daily.    . irbesartan (AVAPRO) 300 MG tablet Take 1 tablet (300 mg total) by mouth daily. 90 tablet 1  . metFORMIN (GLUCOPHAGE XR) 500 MG 24 hr tablet Take 2 tablets (1,000 mg total) by mouth daily with supper. 180 tablet 1  . omeprazole (PRILOSEC) 20 MG capsule Take 20 mg by mouth daily.    . potassium citrate (UROCIT-K) 10 MEQ (1080 MG) SR tablet Take 10 mEq by mouth 3 (three) times daily with meals.    . propranolol ER (INDERAL LA) 80 MG 24 hr capsule TAKE ONE CAPSULE BY MOUTH DAILY 90 capsule 0   No current facility-administered medications for this visit.    Allergies  Allergen Reactions  . Amoxicillin Rash       Objective:  BP (!) 145/80   Pulse 85   Temp 98.4 F (36.9 C) (Oral)   Wt 262 lb (118.8 kg)   BMI 41.04 kg/m  Vitals:   09/08/18 0843 09/08/18 0856  BP: (!) 145/80 (!) 141/87  Pulse: 85   Temp: 98.4 F (36.9 C)  Gen:  alert, not ill-appearing, no distress, appropriate for age, obese female HEENT: head normocephalic without obvious abnormality, conjunctiva and cornea clear, wearing glasses, trachea midline Pulm: Normal work of breathing, normal phonation, clear to auscultation bilaterally, no wheezes, rales or rhonchi CV: Normal rate, regular rhythm, s1 and s2 distinct, no murmurs, clicks or rubs  Neuro: alert and oriented x 3, no tremor MSK: extremities atraumatic, normal gait and station, no peripheral edema Skin: intact, no rashes on exposed skin, no jaundice, no cyanosis Psych: well-groomed, cooperative, good eye contact, anxious mood, affect mood-congruent, speech is articulate, and thought processes clear and goal-directed  BP Readings from Last 3 Encounters:  09/08/18 (!) 145/80  05/23/18 124/84  04/21/18 129/76   Wt Readings from Last 3 Encounters:  09/08/18 262 lb (118.8 kg)  05/23/18 250 lb (113.4  kg)  04/07/18 253 lb (114.8 kg)   Lab Results  Component Value Date   CREATININE 0.79 02/10/2018   BUN 13 02/10/2018   NA 138 02/10/2018   K 4.5 02/10/2018   CL 101 02/10/2018   CO2 29 02/10/2018   Lab Results  Component Value Date   ALT 19 02/10/2018   AST 17 02/10/2018   BILITOT 0.4 02/10/2018   Lab Results  Component Value Date   HGBA1C 5.1 05/31/2017   Lab Results  Component Value Date   CHOL 210 (H) 05/31/2017   HDL 56 05/31/2017   LDLCALC 130 (H) 05/31/2017   TRIG 127 05/31/2017   CHOLHDL 3.8 05/31/2017   Lab Results  Component Value Date   WBC 11.7 (H) 02/10/2018   HGB 14.0 02/10/2018   HCT 41.3 02/10/2018   MCV 89.4 02/10/2018   PLT 368 02/10/2018      No results found for this or any previous visit (from the past 72 hour(s)). No results found.    Assessment and Plan: 38 y.o. female with   .Havana was seen today for hypertension.  Diagnoses and all orders for this visit:  Hypertension goal BP (blood pressure) < 130/80 -     propranolol ER (INDERAL LA) 80 MG 24 hr capsule; Take 1 capsule (80 mg total) by mouth daily. -     irbesartan (AVAPRO) 300 MG tablet; Take 1 tablet (300 mg total) by mouth daily. -     BASIC METABOLIC PANEL WITH GFR  Sinus tachycardia -     propranolol ER (INDERAL LA) 80 MG 24 hr capsule; Take 1 capsule (80 mg total) by mouth daily.  Lymphocytosis -     CBC (INCLUDES DIFF/PLT) WITH PATHOLOGIST REVIEW  Medication monitoring encounter -     BASIC METABOLIC PANEL WITH GFR  History of cardiovascular stress test   BP out of range on 3 office readings today. Patient reports she has not taken her medication today. Home readings are nearly at goal. Cont current medications. Counseled on therapeutic lifestyle changes.  Labs pending  Patient education and anticipatory guidance given Patient agrees with treatment plan Follow-up in 6 months or sooner as needed if symptoms worsen or fail to improve  Darlyne Russian  PA-C

## 2018-09-09 LAB — CBC WITH DIFFERENTIAL/PLATELET
Absolute Monocytes: 442 cells/uL (ref 200–950)
Basophils Absolute: 79 cells/uL (ref 0–200)
Basophils Relative: 1 %
Eosinophils Absolute: 229 cells/uL (ref 15–500)
Eosinophils Relative: 2.9 %
HCT: 39.2 % (ref 35.0–45.0)
Hemoglobin: 13.2 g/dL (ref 11.7–15.5)
Lymphs Abs: 2315 cells/uL (ref 850–3900)
MCH: 31.1 pg (ref 27.0–33.0)
MCHC: 33.7 g/dL (ref 32.0–36.0)
MCV: 92.2 fL (ref 80.0–100.0)
MPV: 9.3 fL (ref 7.5–12.5)
Monocytes Relative: 5.6 %
Neutro Abs: 4835 cells/uL (ref 1500–7800)
Neutrophils Relative %: 61.2 %
Platelets: 334 10*3/uL (ref 140–400)
RBC: 4.25 10*6/uL (ref 3.80–5.10)
RDW: 12.2 % (ref 11.0–15.0)
Total Lymphocyte: 29.3 %
WBC: 7.9 10*3/uL (ref 3.8–10.8)

## 2018-09-09 LAB — BASIC METABOLIC PANEL WITH GFR
BUN: 13 mg/dL (ref 7–25)
CO2: 24 mmol/L (ref 20–32)
Calcium: 9 mg/dL (ref 8.6–10.2)
Chloride: 103 mmol/L (ref 98–110)
Creat: 0.68 mg/dL (ref 0.50–1.10)
GFR, Est African American: 129 mL/min/{1.73_m2} (ref >=60)
GFR, Est Non African American: 111 mL/min/{1.73_m2} (ref >=60)
Glucose, Bld: 92 mg/dL (ref 65–99)
Potassium: 4.6 mmol/L (ref 3.5–5.3)
Sodium: 137 mmol/L (ref 135–146)

## 2018-09-26 DIAGNOSIS — F411 Generalized anxiety disorder: Secondary | ICD-10-CM | POA: Diagnosis not present

## 2018-10-28 DIAGNOSIS — F411 Generalized anxiety disorder: Secondary | ICD-10-CM | POA: Diagnosis not present

## 2018-12-01 ENCOUNTER — Ambulatory Visit (INDEPENDENT_AMBULATORY_CARE_PROVIDER_SITE_OTHER): Payer: BC Managed Care – PPO | Admitting: Physician Assistant

## 2018-12-01 ENCOUNTER — Other Ambulatory Visit: Payer: Self-pay

## 2018-12-01 DIAGNOSIS — Z23 Encounter for immunization: Secondary | ICD-10-CM

## 2018-12-16 DIAGNOSIS — F411 Generalized anxiety disorder: Secondary | ICD-10-CM | POA: Diagnosis not present

## 2019-01-17 ENCOUNTER — Encounter: Payer: Self-pay | Admitting: Emergency Medicine

## 2019-01-17 ENCOUNTER — Other Ambulatory Visit: Payer: Self-pay

## 2019-01-17 ENCOUNTER — Emergency Department (INDEPENDENT_AMBULATORY_CARE_PROVIDER_SITE_OTHER)
Admission: EM | Admit: 2019-01-17 | Discharge: 2019-01-17 | Disposition: A | Discharge status: Home / Self Care | Payer: BC Managed Care – PPO | Source: Home / Self Care | Attending: Emergency Medicine | Admitting: Emergency Medicine

## 2019-01-17 DIAGNOSIS — N23 Unspecified renal colic: Secondary | ICD-10-CM

## 2019-01-17 DIAGNOSIS — N200 Calculus of kidney: Secondary | ICD-10-CM

## 2019-01-17 LAB — POCT URINALYSIS DIP (MANUAL ENTRY)
Bilirubin, UA: NEGATIVE
Glucose, UA: NEGATIVE mg/dL
Ketones, POC UA: NEGATIVE mg/dL
Leukocytes, UA: NEGATIVE
Nitrite, UA: NEGATIVE
Protein Ur, POC: NEGATIVE mg/dL
Spec Grav, UA: 1.02 (ref 1.010–1.025)
Urobilinogen, UA: 0.2 E.U./dL
pH, UA: 6 (ref 5.0–8.0)

## 2019-01-17 MED ORDER — HYDROCODONE-ACETAMINOPHEN 5-325 MG PO TABS: 1.0000 | ORAL_TABLET | Freq: Four times a day (QID) | ORAL | 0 refills | Status: DC | PRN

## 2019-01-17 MED ORDER — IBUPROFEN 600 MG PO TABS: ORAL_TABLET | ORAL | 0 refills | Status: DC

## 2019-01-17 MED ORDER — KETOROLAC TROMETHAMINE 60 MG/2ML IM SOLN
60.0000 mg | Freq: Once | INTRAMUSCULAR | Status: AC
  Administered 2019-01-17: 60 mg via INTRAMUSCULAR

## 2019-01-17 MED ORDER — HYDROCODONE-ACETAMINOPHEN 5-325 MG PO TABS: 1.0000 | ORAL_TABLET | ORAL | 0 refills | Status: DC | PRN

## 2019-01-17 MED ORDER — TAMSULOSIN HCL 0.4 MG PO CAPS: 0.4000 mg | ORAL_CAPSULE | Freq: Two times a day (BID) | ORAL | 0 refills | Status: AC

## 2019-01-17 NOTE — ED Triage Notes (Signed)
Pt c/o flank pain that started yesterday. No other sxs. No urinary discomfort. States normal BM.

## 2019-01-17 NOTE — Discharge Instructions (Addendum)
Based on your history and physical exam and urinalysis (which shows microscopic blood but no sign of infection), your left back/flank pain is likely from kidney stone.  We gave a shot of Toradol, moderate nonnarcotic pain medicine, here in urgent care. Your pain is mild to moderate right now, 3 out of 10, so no emergency action needed at this time.  No imaging needed at this time. Treatment: Prescriptions sent to your pharmacy.  Motrin if needed for moderate pain.  Vicodin if needed for severe pain.  Flomax which will help urinary flow and hopefully help you to pass the kidney stone.  Drink plenty of water.-Urine strainer provided. If severe worsening symptoms, go to emergency room.  Otherwise follow-up with your PCP within 4 to 5 days. Please read attached instruction sheet on kidney stone

## 2019-01-17 NOTE — ED Provider Notes (Signed)
[facility]    CSN: [id number] Arrival date & time: 01/17/19  0855      History   Chief Complaint Chief Complaint  Patient presents with  . Abdominal Pain    HPI Debra Andrade is a 38 y.o. female.   HPI Pt c/o left flank pain that started yesterday. No other sxs. No urinary discomfort. States normal BM.   Started 2 days ago is mild left flank pain, developed to moderate left flank/CVA pain, can be sharp and stabbing and intermittent, worse this morning.  Maximum pain was 7 out of 10 in the car while riding here to urgent care, now pain is 3 out of 10.  Not really affected by movement.  No radiation. Denies fever or chills or nausea or vomiting or diarrhea or change of bowel habits.  Denies any other abdominal pain.  No chest pain or shortness of breath. History of hypertension has been controlled.  Last BMP and creatinine were normal June 2020. Denies any voiding symptoms or dysuria or any known hematuria.  Denies urinary frequency.  Denies history of kidney stone. She denies chance of pregnancy.  Past Medical History:  Diagnosis Date  . Anxiety   . Bipolar affective ([place])   . Depression   . GERD (gastroesophageal reflux disease)   . Obesity     Patient Active Problem List   Diagnosis Date Noted  . History of cardiovascular stress test 09/08/2018  . Chest pain 03/13/2018  . Sinus tachycardia 03/13/2018  . Hypertension goal BP (blood pressure) < 130/80 03/13/2018  . Mood disorder ([place]) 03/13/2018  . Persistent lymphocytosis 02/17/2018  . Uncontrolled hypertension 02/10/2018  . Class 3 severe obesity due to excess calories with serious comorbidity and body mass index (BMI) of 40.0 to 44.9 in adult ([place]) 02/10/2018  . Irregular menses 02/10/2018  . Fatigue 02/10/2018  . Elevated blood pressure reading 05/31/2017  . Loose stools 05/31/2017  . Anxiety disorder 05/31/2017  . Gastroesophageal reflux disease 05/31/2017    Past Surgical History:  Procedure  Laterality Date  . WISDOM TOOTH EXTRACTION      OB History    Gravida  1   Para  0   Term      Preterm      AB  1   Living        SAB      TAB      Ectopic      Multiple      Live Births               Home Medications    Prior to Admission medications   Medication Sig Start Date End Date Taking? Authorizing Provider  cetirizine (ZYRTEC) 10 MG tablet Take 10 mg by mouth daily.    [provider]  Cranberry 360 MG CAPS Take by mouth.    [provider]  cyanocobalamin 500 MCG tablet Take 500 mcg by mouth daily.    [provider]  FLUoxetine (PROZAC) 40 MG capsule Take 40 mg by mouth daily.    [provider]  HYDROcodone-acetaminophen (NORCO/VICODIN) 5-325 MG tablet Take 1-2 tablets by mouth every 4 (four) hours as needed for severe pain. Take with food. 01/17/19   Jacqulyn Cane, MD  ibuprofen (ADVIL) 600 MG tablet Take 1 by mouth every 6-8 hours with food as needed for moderate pain. 01/17/19   Jacqulyn Cane, MD  irbesartan (AVAPRO) 300 MG tablet Take 1 tablet (300 mg total) by mouth daily.  09/08/18   Carlis Stable, PA-C  omeprazole (PRILOSEC) 20 MG capsule Take 20 mg by mouth daily.    [provider]  potassium citrate (UROCIT-K) 10 MEQ (1080 MG) SR tablet Take 10 mEq by mouth 3 (three) times daily with meals.    [provider]  propranolol ER (INDERAL LA) 80 MG 24 hr capsule Take 1 capsule (80 mg total) by mouth daily. 09/08/18   Carlis Stable, PA-C  tamsulosin (FLOMAX) 0.4 MG CAPS capsule Take 1 capsule (0.4 mg total) by mouth 2 (two) times daily after a meal for 5 days. To help pass kidney stone 01/17/19 01/22/19  Lajean Manes, MD    Family History Family History  Problem Relation Age of Onset  . Kidney disease Mother   . Barrett's esophagus Father   . Esophageal cancer Father   . Hypertension Father   . Hyperlipidemia Father   . Bipolar disorder Brother     Social  History Social History   Tobacco Use  . Smoking status: Never Smoker  . Smokeless tobacco: Never Used  Substance Use Topics  . Alcohol use: Yes    Alcohol/week: 1.0 standard drinks    Types: 1 Standard drinks or equivalent per week  . Drug use: No     Allergies   Metformin and related and Amoxicillin   Review of Systems Review of Systems  All other systems reviewed and are negative.    Physical Exam Triage Vital Signs ED Triage Vitals  Enc Vitals Group     BP 01/17/19 0912 (!) 154/93     Pulse Rate 01/17/19 0912 72     Resp --      Temp 01/17/19 0912 98.7 F (37.1 C)     Temp Source 01/17/19 0912 Oral     SpO2 01/17/19 0912 98 %     Weight 01/17/19 0913 270 lb (122.5 kg)     Height --      Head Circumference --      Peak Flow --      Pain Score 01/17/19 0911 3     Pain Loc --      Pain Edu? --      Excl. in GC? --    No data found.  Updated Vital Signs BP (!) 154/93 (BP Location: Right Arm)   Pulse 72   Temp 98.7 F (37.1 C) (Oral)   Wt 122.5 kg   LMP 01/05/2019 (Approximate)   SpO2 98%   BMI 42.29 kg/m   Visual Acuity Right Eye Distance:   Left Eye Distance:   Bilateral Distance:    Right Eye Near:   Left Eye Near:    Bilateral Near:     Physical Exam Vitals signs reviewed.  Constitutional:      General: She is not in acute distress.    Appearance: She is well-developed. She is not ill-appearing, toxic-appearing or diaphoretic.     Comments: No acute distress.  HENT:     Head: Normocephalic and atraumatic.     Mouth/Throat:     Mouth: Mucous membranes are moist.  Eyes:     General: No scleral icterus.    Pupils: Pupils are equal, round, and reactive to light.  Neck:     Musculoskeletal: Normal range of motion and neck supple.  Cardiovascular:     Rate and Rhythm: Normal rate and regular rhythm.     Heart sounds: Normal heart sounds.  Pulmonary:     Effort: Pulmonary effort is normal.  Breath sounds: Normal breath sounds.   Abdominal:     General: Abdomen is protuberant. Bowel sounds are normal. There is no distension.     Palpations: Abdomen is soft.     Tenderness: There is no abdominal tenderness. There is no right CVA tenderness, left CVA tenderness, guarding or rebound. Negative signs include Murphy's sign and McBurney's sign.     Hernia: No hernia is present.     Comments: Abdominal exam negative, except she points to the left CVA and flank area where her pain is.  There is no point tenderness in this area.  Skin:    General: Skin is warm and dry.     Findings: No rash.  Neurological:     Mental Status: She is alert and oriented to person, place, and time.     Cranial Nerves: No cranial nerve deficit.  Psychiatric:        Behavior: Behavior normal.      UC Treatments / Results  Labs (all labs ordered are listed, but only abnormal results are displayed) Labs Reviewed  POCT URINALYSIS DIP (MANUAL ENTRY) - Abnormal; Notable for the following components:      Result Value   Blood, UA large (*)    All other components within normal limits    EKG   Radiology No results found.  Procedures Procedures (including critical care time)  Medications Ordered in UC Medications  ketorolac (TORADOL) injection 60 mg (60 mg Intramuscular Given 01/17/19 0952)    Initial Impression / Assessment and Plan / UC Course   Based on history and physical exam and urinalysis showing blood but no sign of infection.  Likely diagnosis is left kidney stone and mild renal colic.  Currently not in severe pain,mild pain 3 out of 10.-No history or physical findings to suggest any other intra-abdominal process. We discussed options at length.  She declined imaging at this time and prefers treatment and follow-up with PCP, but advised would need to go to emergency room for imaging and further treatment if any severe pain unrelieved by treatment.  She voiced understanding.  Toradol 60 mg IM.- Patient later reevaluated and  pain level decreased from 3 to 2 out of 10  I have reviewed the triage vital signs and the nursing notes.  Pertinent labs & imaging results that were available during my care of the patient were reviewed by me and considered in my medical decision making (see chart for details).      Final Clinical Impressions(s) / UC Diagnoses   Final diagnoses:  Renal colic on left side  Kidney stone on left side     Discharge Instructions     Based on your history and physical exam and urinalysis (which shows microscopic blood but no sign of infection), your left back/flank pain is likely from kidney stone.  We gave a shot of Toradol, moderate nonnarcotic pain medicine, here in urgent care. Your pain is mild to moderate right now, 3 out of 10, so no emergency action needed at this time.  No imaging needed at this time. Treatment: Prescriptions sent to your pharmacy.  Motrin if needed for moderate pain.  Vicodin if needed for severe pain.  Flomax which will help urinary flow and hopefully help you to pass the kidney stone.  Drink plenty of water.-Urine strainer provided. If severe worsening symptoms, go to emergency room.  Otherwise follow-up with your PCP within 4 to 5 days. Please read attached instruction sheet on kidney stone  ED Prescriptions    Medication Sig Dispense Auth. Provider   tamsulosin (FLOMAX) 0.4 MG CAPS capsule Take 1 capsule (0.4 mg total) by mouth 2 (two) times daily after a meal for 5 days. To help pass kidney stone 10 capsule Lajean ManesMassey, David, MD   ibuprofen (ADVIL) 600 MG tablet Take 1 by mouth every 6-8 hours with food as needed for moderate pain. 30 tablet Lajean ManesMassey, David, MD   HYDROcodone-acetaminophen (NORCO/VICODIN) 5-325 MG tablet  (Status: Discontinued) Take 1-2 tablets by mouth every 6 (six) hours as needed for severe pain. Take with food. 12 tablet Lajean ManesMassey, David, MD   HYDROcodone-acetaminophen (NORCO/VICODIN) 5-325 MG tablet Take 1-2 tablets by mouth every 4 (four) hours  as needed for severe pain. Take with food. 12 tablet Lajean ManesMassey, David, MD     I have reviewed the PDMP during this encounter.   Precautions discussed. Red flags discussed. Questions invited and answered. Patient voiced understanding and agreement.    Lajean ManesMassey, David, MD 01/17/19 1257

## 2019-01-23 ENCOUNTER — Encounter: Payer: Self-pay | Admitting: Osteopathic Medicine

## 2019-01-23 ENCOUNTER — Other Ambulatory Visit: Payer: Self-pay

## 2019-01-23 ENCOUNTER — Ambulatory Visit (INDEPENDENT_AMBULATORY_CARE_PROVIDER_SITE_OTHER): Payer: BC Managed Care – PPO

## 2019-01-23 ENCOUNTER — Ambulatory Visit (INDEPENDENT_AMBULATORY_CARE_PROVIDER_SITE_OTHER): Payer: BC Managed Care – PPO | Admitting: Osteopathic Medicine

## 2019-01-23 VITALS — BP 137/82 | HR 75 | Wt 270.0 lb

## 2019-01-23 DIAGNOSIS — N132 Hydronephrosis with renal and ureteral calculous obstruction: Secondary | ICD-10-CM | POA: Diagnosis not present

## 2019-01-23 DIAGNOSIS — N200 Calculus of kidney: Secondary | ICD-10-CM | POA: Diagnosis not present

## 2019-01-23 DIAGNOSIS — R1030 Lower abdominal pain, unspecified: Secondary | ICD-10-CM

## 2019-01-23 DIAGNOSIS — R000 Tachycardia, unspecified: Secondary | ICD-10-CM

## 2019-01-23 DIAGNOSIS — I10 Essential (primary) hypertension: Secondary | ICD-10-CM

## 2019-01-23 DIAGNOSIS — R109 Unspecified abdominal pain: Secondary | ICD-10-CM | POA: Diagnosis not present

## 2019-01-23 MED ORDER — IRBESARTAN 300 MG PO TABS: 150.0000 mg | ORAL_TABLET | Freq: Every day | ORAL | 1 refills | Status: DC

## 2019-01-23 MED ORDER — PROPRANOLOL HCL ER 80 MG PO CP24: 80.0000 mg | ORAL_CAPSULE | Freq: Every day | ORAL | 1 refills | Status: DC

## 2019-01-23 NOTE — Progress Notes (Addendum)
Virtual Visit via Video (App used: Doximity) Note  I connected with      Jairo Ben on 01/23/19 at 8:22 AM  by a telemedicine application and verified that I am speaking with the correct person using two identifiers.  Patient is at home I am in office    I discussed the limitations of evaluation and management by telemedicine and the availability of in person appointments. The patient expressed understanding and agreed to proceed.  History of Present Illness: Debra Andrade is a 38 y.o. female who would like to discuss HTN    Recently seen next-door in urgent care for kidney stone, 01/17/2019.  Prescribed pain medication and tamsulosin.  Blood pressure was above goal at that visit, understandably his pain was an issue. Still having flank pain, unable to strain urine at work.   BP borderline, pt does not check this frequently, no CP/SOB.  He is actually taking the irbesartan 300 mg at half a tablet, so 150 mg.  Looks like the change was made up to 300 sometime in January but I cannot find any documentation about patient being educated on an increase BP Readings from Last 3 Encounters:  01/23/19 137/82  01/17/19 (!) 154/93  09/08/18 (!) 141/87          Observations/Objective: BP 137/82 (Patient Position: Sitting, Cuff Size: Normal)   Pulse 75   Wt 270 lb (122.5 kg)   LMP 01/05/2019 (Approximate)   BMI 42.29 kg/m  BP Readings from Last 3 Encounters:  01/23/19 137/82  01/17/19 (!) 154/93  09/08/18 (!) 141/87   Exam: Normal Speech.    Lab and Radiology Results No results found for this or any previous visit (from the past 72 hour(s)). No results found.     Assessment and Plan: 38 y.o. female with The primary encounter diagnosis was Hypertension goal BP (blood pressure) < 130/80. Diagnoses of Sinus tachycardia, Lower abdominal pain, and Left renal stone were also pertinent to this visit.  Patient was advised since symptoms have been going on for about a week  plus, we can go ahead and get CT scan to evaluate for size/location of any stone.  May need urology referral.  Updated chart with regard to blood pressure medication, could certainly consider increasing from 150 to 300 mg since blood pressure is borderline and patient is fairly young we want to be aggressive about getting her to 130/80 or less goal.  Patient is advised to take her blood pressure a few times a week and keep me posted on how that is looking.  PDMP not reviewed this encounter. Orders Placed This Encounter  Procedures  . CT RENAL STONE STUDY    Order Specific Question:   Is patient pregnant?    Answer:   No    Order Specific Question:   Preferred imaging location?    Answer:   [facility]    Order Specific Question:   Radiology Contrast Protocol - do NOT remove file path    Answer:   \\[email or web address]   Meds ordered this encounter  Medications  . irbesartan (AVAPRO) 300 MG tablet    Sig: Take 0.5 tablets (150 mg total) by mouth daily.    Dispense:  90 tablet    Refill:  1  . propranolol ER (INDERAL LA) 80 MG 24 hr capsule    Sig: Take 1 capsule (80 mg total) by mouth daily.    Dispense:  90 capsule    Refill:  1  ADDENDUM 01/24/19   Ct Renal Stone Study  Result Date: 01/23/2019 CLINICAL DATA:  Left flank pain with microscopic hematuria EXAM: CT ABDOMEN AND PELVIS WITHOUT CONTRAST TECHNIQUE: Multidetector CT imaging of the abdomen and pelvis was performed following the standard protocol without oral or IV contrast. COMPARISON:  None. FINDINGS: Lower chest: Lung bases are clear. Hepatobiliary: No focal liver lesions are evident on this noncontrast enhanced study. The gallbladder wall is not appreciably thickened. There is no biliary duct dilatation. Pancreas: There is no pancreatic mass or inflammatory focus. Spleen: No splenic lesions are evident. Adrenals/Urinary Tract: Adrenals bilaterally appear unremarkable. There is no evident  renal mass on either side. There is moderate hydronephrosis on the left. There is no appreciable hydronephrosis on the right. There is no intrarenal calculus on either side. There is a calculus at the left ureteropelvic junction measuring 8 x 7 mm. No other ureteral calculi are evident on either side. Urinary bladder is midline with wall thickness within normal limits. Stomach/Bowel: There is no appreciable bowel wall or mesenteric thickening. There is no evident bowel obstruction. The terminal ileum appears unremarkable. There is no evident free air or portal venous air. Vascular/Lymphatic: There is no abdominal aortic aneurysm. No vascular lesions are evident on this noncontrast enhanced study. There is no evident adenopathy in the abdomen or pelvis. Reproductive: Uterus is anteverted.  No evident pelvic mass. Other: Appendix appears unremarkable. There is no abscess or ascites in the abdomen or pelvis. There is a small ventral hernia containing only fat. Musculoskeletal: There are no blastic or lytic bone lesions. There is no intramuscular lesion. IMPRESSION: 1. There is an 8 x 7 mm calculus at the left ureteropelvic junction with moderate hydronephrosis on the left. 2. No evident bowel obstruction. No abscess in the abdomen or pelvis. Appendix appears normal. 3.  Small ventral hernia containing only fat. These results will be called to the ordering clinician or representative by the Radiologist Assistant, and communication documented in the PACS or zVision Dashboard. Electronically Signed   By: Bretta BangWilliam  Woodruff III M.D.   On: 01/23/2019 17:35    REFERRAL MADE TO UROLOGY  Please see result notes for full details.  I have called the patient last night when I got the results, left a voicemail.  I sent a MyChart message this morning.      Follow Up Instructions: Return for ANNUAL (call week prior to visit for lab orders) in about 3 to 4 months.    I discussed the assessment and treatment plan with the  patient. The patient was provided an opportunity to ask questions and all were answered. The patient agreed with the plan and demonstrated an understanding of the instructions.   The patient was advised to call back or seek an in-person evaluation if any new concerns, if symptoms worsen or if the condition fails to improve as anticipated.  25 minutes of non-face-to-face time was provided during this encounter.                      Historical information moved to improve visibility of documentation.  Past Medical History:  Diagnosis Date  . Anxiety   . Bipolar affective ([facility])   . Depression   . GERD (gastroesophageal reflux disease)   . Obesity    Past Surgical History:  Procedure Laterality Date  . WISDOM TOOTH EXTRACTION     Social History   Tobacco Use  . Smoking status: Never Smoker  . Smokeless tobacco: Never Used  Substance Use Topics  . Alcohol use: Yes    Alcohol/week: 1.0 standard drinks    Types: 1 Standard drinks or equivalent per week   family history includes Barrett's esophagus in her father; Bipolar disorder in her brother; Esophageal cancer in her father; Hyperlipidemia in her father; Hypertension in her father; Kidney disease in her mother.  Medications: Current Outpatient Medications  Medication Sig Dispense Refill  . cetirizine (ZYRTEC) 10 MG tablet Take 10 mg by mouth daily.    . cyanocobalamin 500 MCG tablet Take 500 mcg by mouth daily.    Marland Kitchen FLUoxetine (PROZAC) 40 MG capsule Take 40 mg by mouth daily.    Marland Kitchen HYDROcodone-acetaminophen (NORCO/VICODIN) 5-325 MG tablet Take 1-2 tablets by mouth every 4 (four) hours as needed for severe pain. Take with food. 12 tablet 0  . ibuprofen (ADVIL) 600 MG tablet Take 1 by mouth every 6-8 hours with food as needed for moderate pain. 30 tablet 0  . omeprazole (PRILOSEC) 20 MG capsule Take 20 mg by mouth daily.    . Cranberry 360 MG CAPS Take by mouth.    . irbesartan (AVAPRO) 300 MG tablet Take 0.5  tablets (150 mg total) by mouth daily. 90 tablet 1  . potassium citrate (UROCIT-K) 10 MEQ (1080 MG) SR tablet Take 10 mEq by mouth 3 (three) times daily with meals.    . propranolol ER (INDERAL LA) 80 MG 24 hr capsule Take 1 capsule (80 mg total) by mouth daily. 90 capsule 1   No current facility-administered medications for this visit.    Allergies  Allergen Reactions  . Metformin And Related Diarrhea  . Amoxicillin Rash    PDMP not reviewed this encounter. Orders Placed This Encounter  Procedures  . CT RENAL STONE STUDY    Order Specific Question:   Is patient pregnant?    Answer:   No    Order Specific Question:   Preferred imaging location?    Answer:   [facility]    Order Specific Question:   Radiology Contrast Protocol - do NOT remove file path    Answer:   \\[email or web address]   Meds ordered this encounter  Medications  . irbesartan (AVAPRO) 300 MG tablet    Sig: Take 0.5 tablets (150 mg total) by mouth daily.    Dispense:  90 tablet    Refill:  1  . propranolol ER (INDERAL LA) 80 MG 24 hr capsule    Sig: Take 1 capsule (80 mg total) by mouth daily.    Dispense:  90 capsule    Refill:  1

## 2019-01-24 NOTE — Addendum Note (Signed)
Addended by: Maryla Morrow on: 01/24/2019 07:24 AM   Modules accepted: Orders

## 2019-01-25 DIAGNOSIS — N201 Calculus of ureter: Secondary | ICD-10-CM | POA: Diagnosis not present

## 2019-01-26 DIAGNOSIS — F411 Generalized anxiety disorder: Secondary | ICD-10-CM | POA: Diagnosis not present

## 2019-02-03 DIAGNOSIS — N201 Calculus of ureter: Secondary | ICD-10-CM | POA: Diagnosis not present

## 2019-02-03 DIAGNOSIS — N132 Hydronephrosis with renal and ureteral calculous obstruction: Secondary | ICD-10-CM | POA: Diagnosis not present

## 2019-02-09 ENCOUNTER — Other Ambulatory Visit: Payer: Self-pay | Admitting: Urology

## 2019-02-27 ENCOUNTER — Encounter (HOSPITAL_BASED_OUTPATIENT_CLINIC_OR_DEPARTMENT_OTHER): Payer: Self-pay | Admitting: General Practice

## 2019-02-28 ENCOUNTER — Other Ambulatory Visit (HOSPITAL_COMMUNITY)
Admission: RE | Admit: 2019-02-28 | Discharge: 2019-02-28 | Disposition: A | Discharge status: Home / Self Care | Payer: BC Managed Care – PPO | Source: Ambulatory Visit | Attending: Urology | Admitting: Urology

## 2019-02-28 DIAGNOSIS — Z01812 Encounter for preprocedural laboratory examination: Secondary | ICD-10-CM | POA: Diagnosis not present

## 2019-02-28 DIAGNOSIS — Z20828 Contact with and (suspected) exposure to other viral communicable diseases: Secondary | ICD-10-CM | POA: Insufficient documentation

## 2019-02-28 LAB — SARS CORONAVIRUS 2 (TAT 6-24 HRS): SARS Coronavirus 2: NEGATIVE

## 2019-03-01 NOTE — H&P (Signed)
Office Visit Report     02/03/2019   --------------------------------------------------------------------------------   Debra Andrade  MRN: [id number]  DOB: 1980/11/28, 38 year old Female  SSN:    PRIMARY CARE:  Emeterio Reeve, DO  REFERRING:  Emeterio Reeve, DO  PROVIDER:  Link Snuffer, III, M.D.  LOCATION:  [facility] [phone]     --------------------------------------------------------------------------------   CC/HPI: CC: Left ureteropelvic junction calculus  HPI:  38 year old female referred to me by Dr. [name] for an 8 mm proximal left ureteral calculus at the UPJ. The patient was initially having left flank pain. However, this has nearly resolved. KUB shows a persistent calculus. She denies any fever, chill, nausea, vomiting. Urinalysis is negative     ALLERGIES: None   MEDICATIONS: Flomax 0.4 mg capsule 1 capsule PO Daily  Hydrocodone-Acetaminophen 5 mg-325 mg tablet 1-2 tablet PO Q 6 H PRN  Zofran 4 mg tablet 1 tablet PO Q 6 H PRN     GU PSH: None   NON-GU PSH: None   GU PMH: Ureteral calculus - 01/25/2019    NON-GU PMH: None   FAMILY HISTORY: None   SOCIAL HISTORY: None   REVIEW OF SYSTEMS:    GU Review Female:   Patient denies frequent urination, hard to postpone urination, burning /pain with urination, get up at night to urinate, leakage of urine, stream starts and stops, trouble starting your stream, have to strain to urinate, and being pregnant.  Gastrointestinal (Upper):   Patient denies nausea, vomiting, and indigestion/ heartburn.  Gastrointestinal (Lower):   Patient denies diarrhea and constipation.  Constitutional:   Patient denies fever, night sweats, weight loss, and fatigue.  Skin:   Patient denies skin rash/ lesion and itching.  Eyes:   Patient denies blurred vision and double vision.  Ears/ Nose/ Throat:   Patient denies sore throat and sinus problems.  Hematologic/Lymphatic:   Patient denies swollen glands and  easy bruising.  Cardiovascular:   Patient denies leg swelling and chest pains.  Respiratory:   Patient denies cough and shortness of breath.  Endocrine:   Patient denies excessive thirst.  Musculoskeletal:   Patient denies back pain and joint pain.  Neurological:   Patient denies headaches and dizziness.  Psychologic:   Patient denies depression and anxiety.   Notes: abdominal pain have NOT been straining     VITAL SIGNS:      02/03/2019 03:16 PM  BP 132/83 mmHg  Heart Rate 76 /min  Temperature 97.5 F / 36.3 C   MULTI-SYSTEM PHYSICAL EXAMINATION:    Constitutional: Well-nourished. No physical deformities. Normally developed. Good grooming.  Respiratory: No labored breathing, no use of accessory muscles.   Cardiovascular: Normal temperature, adequate perfusion of extremities  Skin: No paleness, no jaundice  Neurologic / Psychiatric: Oriented to time, oriented to place, oriented to person. No depression, no anxiety, no agitation.  Gastrointestinal: No mass, no tenderness, no rigidity, mildly obese abdomen. No CVA tenderness  Eyes: Normal conjunctivae. Normal eyelids.  Musculoskeletal: Normal gait and station of head and neck.     PAST DATA REVIEWED:  Source Of History:  Patient  Records Review:   Previous Doctor Records, Previous Patient Records  X-Ray Review: C.T. Abdomen/Pelvis: Reviewed Films. Reviewed Report. Discussed With Patient.     PROCEDURES:         KUB - [id number]  A single view of the abdomen is obtained.  Stable round radiopacity consistent with prior stone seen at the left UPJ  Patient confirmed No Neulasta OnPro Device.           Urinalysis Dipstick Dipstick Cont'd  Color: Yellow Bilirubin: Neg mg/dL  Appearance: Clear Ketones: Neg mg/dL  Specific Gravity: 1.017 Blood: Neg ery/uL  pH: 5.5 Protein: Neg mg/dL  Glucose: Neg mg/dL Urobilinogen: 0.2 mg/dL    Nitrites: Neg    Leukocyte Esterase: Neg leu/uL    ASSESSMENT:      ICD-10 Details  1 GU:    Ureteral calculus - N20.1   2   Ureteral obstruction secondary to calculous - N13.2    PLAN:           Orders Labs Urine Culture          Document Letter(s):  Created for Patient: Clinical Summary         Notes:   We discussed the management of urinary stones. These options include observation, ureteroscopy, and shockwave lithotripsy. We discussed which options are relevant to these particular stones. We discussed the natural history of stones as well as the complications of untreated stones and the impact on quality of life without treatment as well as with each of the above listed treatments. We also discussed the efficacy of each treatment in its ability to clear the stone burden. With any of these management options I discussed the signs and symptoms of infection and the need for emergent treatment should these be experienced. For each option we discussed the ability of each procedure to clear the patient of their stone burden.   For observation I described the risks which include but are not limited to silent renal damage, life-threatening infection, need for emergent surgery, failure to pass stone, and pain.   For ureteroscopy I described the risks which include heart attack, stroke, pulmonary embolus, death, bleeding, infection, damage to contiguous structures, positioning injury, ureteral stricture, ureteral avulsion, ureteral injury, need for ureteral stent, inability to perform ureteroscopy, need for an interval procedure, inability to clear stone burden, stent discomfort and pain.   For shockwave lithotripsy I described the risks which include arrhythmia, kidney contusion, kidney hemorrhage, need for transfusion, pain, inability to break up stone, inability to pass stone fragments, Steinstrasse, infection associated with obstructing stones, need for different surgical procedure, need for repeat shockwave lithotripsy.   CC: Dr. Lyn Hollingshead    * Signed by Modena Slater, III, M.D. on  02/03/19 at 3:57 PM (EST)*     The information contained in this medical record document is considered private and confidential patient information. This information can only be used for the medical diagnosis and/or medical services that are being provided by the patient's selected caregivers. This information can only be distributed outside of the patient's care if the patient agrees and signs waivers of authorization for this information to be sent to an outside source or route.

## 2019-03-02 ENCOUNTER — Ambulatory Visit (HOSPITAL_COMMUNITY)
Admission: RE | Admit: 2019-03-02 | Discharge: 2019-03-02 | Disposition: A | Discharge status: Home / Self Care | Payer: BC Managed Care – PPO | Attending: Urology | Admitting: Urology

## 2019-03-02 ENCOUNTER — Encounter (HOSPITAL_BASED_OUTPATIENT_CLINIC_OR_DEPARTMENT_OTHER)
Admission: RE | Disposition: A | Discharge status: Home / Self Care | Payer: Self-pay | Source: Home / Self Care | Attending: Urology

## 2019-03-02 ENCOUNTER — Encounter (HOSPITAL_BASED_OUTPATIENT_CLINIC_OR_DEPARTMENT_OTHER): Payer: Self-pay | Admitting: Emergency Medicine

## 2019-03-02 ENCOUNTER — Other Ambulatory Visit: Payer: Self-pay

## 2019-03-02 ENCOUNTER — Ambulatory Visit (HOSPITAL_COMMUNITY): Payer: BC Managed Care – PPO

## 2019-03-02 DIAGNOSIS — N201 Calculus of ureter: Secondary | ICD-10-CM | POA: Insufficient documentation

## 2019-03-02 DIAGNOSIS — Z01818 Encounter for other preprocedural examination: Secondary | ICD-10-CM | POA: Diagnosis not present

## 2019-03-02 DIAGNOSIS — N200 Calculus of kidney: Secondary | ICD-10-CM | POA: Diagnosis not present

## 2019-03-02 HISTORY — DX: Personal history of urinary calculi: Z87.442

## 2019-03-02 HISTORY — DX: Essential (primary) hypertension: I10

## 2019-03-02 HISTORY — PX: EXTRACORPOREAL SHOCK WAVE LITHOTRIPSY: SHX1557

## 2019-03-02 HISTORY — DX: Family history of other specified conditions: Z84.89

## 2019-03-02 LAB — POCT PREGNANCY, URINE: Preg Test, Ur: NEGATIVE

## 2019-03-02 SURGERY — LITHOTRIPSY, ESWL
Anesthesia: LOCAL | Laterality: Left

## 2019-03-02 MED ORDER — CIPROFLOXACIN HCL 500 MG PO TABS
500.0000 mg | ORAL_TABLET | ORAL | Status: AC
  Administered 2019-03-02: 500 mg via ORAL
  Filled 2019-03-02 (×2): qty 1

## 2019-03-02 MED ORDER — DIPHENHYDRAMINE HCL 25 MG PO CAPS
ORAL_CAPSULE | ORAL | Status: AC
  Filled 2019-03-02: qty 1

## 2019-03-02 MED ORDER — DIPHENHYDRAMINE HCL 25 MG PO CAPS
25.0000 mg | ORAL_CAPSULE | ORAL | Status: AC
  Administered 2019-03-02: 25 mg via ORAL
  Filled 2019-03-02: qty 1

## 2019-03-02 MED ORDER — DIAZEPAM 5 MG PO TABS
ORAL_TABLET | ORAL | Status: AC
  Filled 2019-03-02: qty 2

## 2019-03-02 MED ORDER — TAMSULOSIN HCL 0.4 MG PO CAPS: 0.4000 mg | ORAL_CAPSULE | Freq: Every day | ORAL | 0 refills | Status: DC

## 2019-03-02 MED ORDER — SODIUM CHLORIDE 0.9 % IV SOLN
INTRAVENOUS | Status: DC
  Administered 2019-03-02: 07:00:00 via INTRAVENOUS
  Filled 2019-03-02: qty 1000

## 2019-03-02 MED ORDER — DIAZEPAM 5 MG PO TABS
10.0000 mg | ORAL_TABLET | ORAL | Status: AC
  Administered 2019-03-02: 10 mg via ORAL
  Filled 2019-03-02: qty 2

## 2019-03-02 MED ORDER — IBUPROFEN 600 MG PO TABS: ORAL_TABLET | ORAL | 0 refills | Status: DC

## 2019-03-02 NOTE — Interval H&P Note (Signed)
History and Physical Interval Note:  03/02/2019 7:48 AM  Debra Andrade  has presented today for surgery, with the diagnosis of left ureteral stone.  The various methods of treatment have been discussed with the patient and family. After consideration of risks, benefits and other options for treatment, the patient has consented to  Procedure(s): EXTRACORPOREAL SHOCK WAVE LITHOTRIPSY (ESWL) (Left) as a surgical intervention.  The patient's history has been reviewed, patient examined, no change in status, stable for surgery.  I have reviewed the patient's chart and labs. KUB with stable 9 mm left UPJ stone. She denies fever or dysuria. Questions were answered to the patient's satisfaction.     Festus Aloe

## 2019-03-02 NOTE — Op Note (Signed)
Left 8 mm UPJ stone  Left ESWL  Findings: the stone fragmented down to one large piece, she tolerated procedure well. She may need a staged procedure if she fails to pass the fragment(s).

## 2019-03-02 NOTE — Discharge Instructions (Signed)
Lithotripsy, Care After °This sheet gives you information about how to care for yourself after your procedure. Your health care provider may also give you more specific instructions. If you have problems or questions, contact your health care provider. °What can I expect after the procedure? °After the procedure, it is common to have: °· Some blood in your urine. This should only last for a few days. °· Soreness in your back, sides, or upper abdomen for a few days. °· Blotches or bruises on your back where the pressure wave entered the skin. °· Pain, discomfort, or nausea when pieces (fragments) of the kidney stone move through the tube that carries urine from the kidney to the bladder (ureter). Stone fragments may pass soon after the procedure, but they may continue to pass for up to 4-8 weeks. °? If you have severe pain or nausea, contact your health care provider. This may be caused by a large stone that was not broken up, and this may mean that you need more treatment. °· Some pain or discomfort during urination. °· Some pain or discomfort in the lower abdomen or (in men) at the base of the penis. °Follow these instructions at home: °Medicines °· Take over-the-counter and prescription medicines only as told by your health care provider. °· If you were prescribed an antibiotic medicine, take it as told by your health care provider. Do not stop taking the antibiotic even if you start to feel better. °· Do not drive for 24 hours if you were given a medicine to help you relax (sedative). °· Do not drive or use heavy machinery while taking prescription pain medicine. °Eating and drinking °· Drink enough water and fluids to keep your urine clear or pale yellow. This helps any remaining pieces of the stone to pass. It can also help prevent new stones from forming. °· Eat plenty of fresh fruits and vegetables. °· Follow instructions from your health care provider about eating and drinking restrictions. You may be  instructed: °? To reduce how much salt (sodium) you eat or drink. Check ingredients and nutrition facts on packaged foods and beverages. °? To reduce how much meat you eat. °· Eat the recommended amount of calcium for your age and gender. Ask your health care provider how much calcium you should have. °General instructions °· Get plenty of rest. °· Most people can resume normal activities 1-2 days after the procedure. Ask your health care provider what activities are safe for you. °· Your health care provider may direct you to lie in a certain position (postural drainage) and tap firmly (percuss) over your kidney area to help stone fragments pass. Follow instructions as told by your health care provider. °· If directed, strain all urine through the strainer that was provided by your health care provider. °? Keep all fragments for your health care provider to see. Any stones that are found may be sent to a medical lab for examination. The stone may be as small as a grain of salt. °· Keep all follow-up visits as told by your health care provider. This is important. °Contact a health care provider if: °· You have pain that is severe or does not get better with medicine. °· You have nausea that is severe or does not go away. °· You have blood in your urine longer than your health care provider told you to expect. °· You have more blood in your urine. °· You have pain during urination that does not go away. °·   You urinate more frequently than usual and this does not go away. °· You develop a rash or any other possible signs of an allergic reaction. °Get help right away if: °· You have severe pain in your back, sides, or upper abdomen. °· You have severe pain while urinating. °· Your urine is very dark red. °· You have blood in your stool (feces). °· You cannot pass any urine at all. °· You feel a strong urge to urinate after emptying your bladder. °· You have a fever or chills. °· You develop shortness of breath,  difficulty breathing, or chest pain. °· You have severe nausea that leads to persistent vomiting. °· You faint. °Summary °· After this procedure, it is common to have some pain, discomfort, or nausea when pieces (fragments) of the kidney stone move through the tube that carries urine from the kidney to the bladder (ureter). If this pain or nausea is severe, however, you should contact your health care provider. °· Most people can resume normal activities 1-2 days after the procedure. Ask your health care provider what activities are safe for you. °· Drink enough water and fluids to keep your urine clear or pale yellow. This helps any remaining pieces of the stone to pass, and it can help prevent new stones from forming. °· If directed, strain your urine and keep all fragments for your health care provider to see. Fragments or stones may be as small as a grain of salt. °· Get help right away if you have severe pain in your back, sides, or upper abdomen or have severe pain while urinating. °This information is not intended to replace advice given to you by your health care provider. Make sure you discuss any questions you have with your health care provider. °Document Released: 04/05/2007 Document Revised: 06/27/2018 Document Reviewed: 02/05/2016 °Elsevier Patient Education © 2020 [employer] ° ° °Moderate Conscious Sedation, Adult, Care After °These instructions provide you with information about caring for yourself after your procedure. Your health care provider may also give you more specific instructions. Your treatment has been planned according to current medical practices, but problems sometimes occur. Call your health care provider if you have any problems or questions after your procedure. °What can I expect after the procedure? °After your procedure, it is common: °· To feel sleepy for several hours. °· To feel clumsy and have poor balance for several hours. °· To have poor judgment for several  hours. °· To vomit if you eat too soon. °Follow these instructions at home: °For at least 24 hours after the procedure: ° °· Do not: °? Participate in activities where you could fall or become injured. °? Drive. °? Use heavy machinery. °? Drink alcohol. °? Take sleeping pills or medicines that cause drowsiness. °? Make important decisions or sign legal documents. °? Take care of children on your own. °· Rest. °Eating and drinking °· Follow the diet recommended by your health care provider. °· If you vomit: °? Drink water, juice, or soup when you can drink without vomiting. °? Make sure you have little or no nausea before eating solid foods. °General instructions °· Have a responsible adult stay with you until you are awake and alert. °· Take over-the-counter and prescription medicines only as told by your health care provider. °· If you smoke, do not smoke without supervision. °· Keep all follow-up visits as told by your health care provider. This is important. °Contact a health care provider if: °· You keep feeling   nauseous or you keep vomiting. °· You feel light-headed. °· You develop a rash. °· You have a fever. °Get help right away if: °· You have trouble breathing. °This information is not intended to replace advice given to you by your health care provider. Make sure you discuss any questions you have with your health care provider. °Document Released: 01/04/2013 Document Revised: 02/26/2017 Document Reviewed: 07/06/2015 °Elsevier Patient Education © 2020 [employer] ° ° °

## 2019-03-03 ENCOUNTER — Encounter (HOSPITAL_BASED_OUTPATIENT_CLINIC_OR_DEPARTMENT_OTHER): Payer: Self-pay | Admitting: Urology

## 2019-03-10 ENCOUNTER — Ambulatory Visit: Payer: BLUE CROSS/BLUE SHIELD | Admitting: Physician Assistant

## 2019-03-16 DIAGNOSIS — N201 Calculus of ureter: Secondary | ICD-10-CM | POA: Diagnosis not present

## 2019-03-17 DIAGNOSIS — N201 Calculus of ureter: Secondary | ICD-10-CM | POA: Diagnosis not present

## 2019-04-20 DIAGNOSIS — F411 Generalized anxiety disorder: Secondary | ICD-10-CM | POA: Diagnosis not present

## 2019-05-30 DIAGNOSIS — F411 Generalized anxiety disorder: Secondary | ICD-10-CM | POA: Diagnosis not present

## 2019-06-29 ENCOUNTER — Encounter: Payer: Self-pay | Admitting: Osteopathic Medicine

## 2019-06-29 ENCOUNTER — Other Ambulatory Visit: Payer: Self-pay

## 2019-06-29 ENCOUNTER — Ambulatory Visit (INDEPENDENT_AMBULATORY_CARE_PROVIDER_SITE_OTHER): Payer: BC Managed Care – PPO | Admitting: Osteopathic Medicine

## 2019-06-29 VITALS — BP 135/84 | HR 79 | Temp 98.1°F | Wt 273.1 lb

## 2019-06-29 DIAGNOSIS — Z0000 Encounter for general adult medical examination without abnormal findings: Secondary | ICD-10-CM

## 2019-06-29 DIAGNOSIS — R000 Tachycardia, unspecified: Secondary | ICD-10-CM

## 2019-06-29 DIAGNOSIS — F418 Other specified anxiety disorders: Secondary | ICD-10-CM

## 2019-06-29 DIAGNOSIS — I10 Essential (primary) hypertension: Secondary | ICD-10-CM | POA: Diagnosis not present

## 2019-06-29 NOTE — Patient Instructions (Addendum)
General Preventive Care  Most recent routine screening labs: ordered today   Tobacco: don't!   Alcohol: responsible moderation is ok for most adults - if you have concerns about your alcohol intake, please talk to me!   Exercise: as tolerated to reduce risk of cardiovascular disease and diabetes. Strength training will also prevent osteoporosis.   Mental health: if need for mental health care (medicines, counseling, other), or concerns about moods, please let me know!   Sexual health: if need for STD testing, or if concerns with libido/pain problems, please let me know! If you need to discuss your birth control options, please let me know!   Advanced Directive: Living Will and/or Healthcare Power of Attorney recommended for all adults, regardless of age or health.  Vaccines  Flu vaccine: for almost everyone, every fall.   Shingles vaccine: after age 86.   Pneumonia vaccines: after age 56  Tetanus booster: every 10 years / 3rd trimester of pregnancy  COVID vaccine: as soon as you're eligible! Please follow [email or web address] for updates on eligibility and vaccination appointment. As of now, we do not anticipate our clinic having vaccines anytime soon.  Cancer screenings   Colon cancer screening: for everyone age 52-75.   Breast cancer screening: mammogram at age 107  Cervical cancer screening: Pap due 2024  Lung cancer screening: not needed for non-smokers  Infection screenings  . HIV: recommended screening at least once age 32-65 . Gonorrhea/Chlamydia: screening as needed . Hepatitis C: recommended once for everyone age 62-75 . TB: certain at-risk populations, or depending on work requirements and/or travel history Other . Bone Density Test: recommended for women at age 64

## 2019-06-29 NOTE — Progress Notes (Signed)
Debra Andrade is a 39 y.o. female who presents to  [facility] at [facility]  today, 06/29/19, seeking care for the following: Annual physical      ASSESSMENT & PLAN with other pertinent history/findings:  The primary encounter diagnosis was Annual physical exam. Diagnoses of Hypertension goal BP (blood pressure) < 130/80, Sinus tachycardia, and Other specified anxiety disorders were also pertinent to this visit.  Constitutional:  . VSS, see nurse notes . General Appearance: alert, well-developed, well-nourished, NAD Eyes: Marland Kitchen Normal lids and conjunctive, non-icteric sclera . PERRLA Ears, Nose, Mouth, Throat: . Normal external auditory canal and TM bilaterally Neck: . No masses, trachea midline . No thyroid enlargement/tenderness/mass appreciated Respiratory: . Normal respiratory effort . No dullness/hyper-resonance to percussion . Breath sounds normal, no wheeze/rhonchi/rales Cardiovascular: . S1/S2 normal, no murmur/rub/gallop auscultated . No lower extremity edema Gastrointestinal: . Nontender, no masses . No hepatomegaly, no splenomegaly . No hernia appreciated Musculoskeletal:  . Gait normal . No clubbing/cyanosis of digits Neurological: . No cranial nerve deficit on limited exam . Motor and sensation intact and symmetric Psychiatric: . Normal judgment/insight . Normal mood and affect    Patient Instructions  General Preventive Care  Most recent routine screening labs: ordered today   Tobacco: don't!   Alcohol: responsible moderation is ok for most adults - if you have concerns about your alcohol intake, please talk to me!   Exercise: as tolerated to reduce risk of cardiovascular disease and diabetes. Strength training will also prevent osteoporosis.   Mental health: if need for mental health care (medicines, counseling, other), or concerns about moods, please let me know!   Sexual health: if need for STD  testing, or if concerns with libido/pain problems, please let me know! If you need to discuss your birth control options, please let me know!   Advanced Directive: Living Will and/or Healthcare Power of Attorney recommended for all adults, regardless of age or health.  Vaccines  Flu vaccine: for almost everyone, every fall.   Shingles vaccine: after age 55.   Pneumonia vaccines: after age 70  Tetanus booster: every 10 years / 3rd trimester of pregnancy  COVID vaccine: as soon as you're eligible! Please follow [email or web address] for updates on eligibility and vaccination appointment. As of now, we do not anticipate our clinic having vaccines anytime soon.  Cancer screenings   Colon cancer screening: for everyone age 77-75.   Breast cancer screening: mammogram at age 32  Cervical cancer screening: Pap due 2024  Lung cancer screening: not needed for non-smokers  Infection screenings  . HIV: recommended screening at least once age 68-65 . Gonorrhea/Chlamydia: screening as needed . Hepatitis C: recommended once for everyone age 44-75 . TB: certain at-risk populations, or depending on work requirements and/or travel history Other . Bone Density Test: recommended for women at age 8    Orders Placed This Encounter  Procedures  . CBC  . COMPLETE METABOLIC PANEL WITH GFR  . Lipid panel  . TSH    No orders of the defined types were placed in this encounter.      Follow-up instructions: Return in about 1 year (around 06/28/2020) for [place] (call week prior to visit for lab orders).                                         BP 135/84 (BP Location: Left Arm,  Patient Position: Sitting, Cuff Size: Normal)   Pulse 79   Temp 98.1 F (36.7 C) (Oral)   Wt 273 lb 1.3 oz (123.9 kg)   BMI 41.52 kg/m   Current Meds  Medication Sig  . brexpiprazole (REXULTI) 1 MG TABS tablet Take by mouth daily.  Marland Kitchen FLUoxetine (PROZAC) 40 MG capsule Take 40 mg by  mouth daily.  . irbesartan (AVAPRO) 300 MG tablet Take 0.5 tablets (150 mg total) by mouth daily.  Marland Kitchen loratadine (CLARITIN) 10 MG tablet Take 10 mg by mouth daily.  Marland Kitchen omeprazole (PRILOSEC) 20 MG capsule Take 20 mg by mouth daily.  . propranolol ER (INDERAL LA) 80 MG 24 hr capsule Take 1 capsule (80 mg total) by mouth daily.    Results for orders placed or performed in visit on 06/29/19 (from the past 72 hour(s))  CBC     Status: None   Collection Time: 06/29/19  1:54 PM  Result Value Ref Range   WBC 8.6 3.8 - 10.8 Thousand/uL   RBC 4.39 3.80 - 5.10 Million/uL   Hemoglobin 13.6 11.7 - 15.5 g/dL   HCT 89.2 11.9 - 41.7 %   MCV 92.5 80.0 - 100.0 fL   MCH 31.0 27.0 - 33.0 pg   MCHC 33.5 32.0 - 36.0 g/dL   RDW 40.8 14.4 - 81.8 %   Platelets 366 140 - 400 Thousand/uL   MPV 9.6 7.5 - 12.5 fL  COMPLETE METABOLIC PANEL WITH GFR     Status: None   Collection Time: 06/29/19  1:54 PM  Result Value Ref Range   Glucose, Bld 93 65 - 99 mg/dL    Comment: .            Fasting reference interval .    BUN 16 7 - 25 mg/dL   Creat 5.63 1.49 - 7.02 mg/dL   GFR, Est Non African American 107 > OR = 60 mL/min/1.28m2   GFR, Est African American 124 > OR = 60 mL/min/1.68m2   BUN/Creatinine Ratio NOT APPLICABLE 6 - 22 (calc)   Sodium 138 135 - 146 mmol/L   Potassium 4.3 3.5 - 5.3 mmol/L   Chloride 102 98 - 110 mmol/L   CO2 29 20 - 32 mmol/L   Calcium 10.0 8.6 - 10.2 mg/dL   Total Protein 6.8 6.1 - 8.1 g/dL   Albumin 4.4 3.6 - 5.1 g/dL   Globulin 2.4 1.9 - 3.7 g/dL (calc)   AG Ratio 1.8 1.0 - 2.5 (calc)   Total Bilirubin 0.4 0.2 - 1.2 mg/dL   Alkaline phosphatase (APISO) 74 31 - 125 U/L   AST 16 10 - 30 U/L   ALT 21 6 - 29 U/L  Lipid panel     Status: Abnormal   Collection Time: 06/29/19  1:54 PM  Result Value Ref Range   Cholesterol 229 (H) <200 mg/dL   HDL 44 (L) > OR = 50 mg/dL   Triglycerides 637 (H) <150 mg/dL    Comment: . If a non-fasting specimen was collected, consider repeat  triglyceride testing on a fasting specimen if clinically indicated.  Perry Mount et al. J. of Clin. Lipidol. 2015;9:129-169. Marland Kitchen    LDL Cholesterol (Calc) 143 (H) mg/dL (calc)    Comment: Reference range: <100 . Desirable range <100 mg/dL for primary prevention;   <70 mg/dL for patients with CHD or diabetic patients  with > or = 2 CHD risk factors. Marland Kitchen LDL-C is now calculated using the Martin-Hopkins  calculation, which is a validated  novel method providing  better accuracy than the Friedewald equation in the  estimation of LDL-C.  Cresenciano Genre et al. Annamaria Helling. 1610;960(45): 2061-2068  (http://education.[employer].com/faq/FAQ164)    Total CHOL/HDL Ratio 5.2 (H) <5.0 (calc)   Non-HDL Cholesterol (Calc) 185 (H) <130 mg/dL (calc)    Comment: For patients with diabetes plus 1 major ASCVD risk  factor, treating to a non-HDL-C goal of <100 mg/dL  (LDL-C of <70 mg/dL) is considered a therapeutic  option.   TSH     Status: None   Collection Time: 06/29/19  1:54 PM  Result Value Ref Range   TSH 3.45 mIU/L    Comment:           Reference Range .           > or = 20 Years  0.40-4.50 .                Pregnancy Ranges           First trimester    0.26-2.66           Second trimester   0.55-2.73           Third trimester    0.43-2.91     No results found.  Depression screen [facility] 2/9 06/29/2019 01/23/2019 02/10/2018  Decreased Interest 2 0 0  Down, Depressed, Hopeless 1 0 0  PHQ - 2 Score 3 0 0  Altered sleeping 2 - 1  Tired, decreased energy 2 - 1  Change in appetite 0 - 0  Feeling bad or failure about yourself  1 - 0  Trouble concentrating 2 - 0  Moving slowly or fidgety/restless 0 - 0  Suicidal thoughts 0 - 0  PHQ-9 Score 10 - 2  Difficult doing work/chores Very difficult - -    GAD 7 : Generalized Anxiety Score 06/29/2019 01/23/2019 02/10/2018  Nervous, Anxious, on Edge 1 1 1   Control/stop worrying 1 0 0  Worry too much - different things 0 0 1  Trouble relaxing 1 0 1  Restless  0 0 0  Easily annoyed or irritable 0 0 0  Afraid - awful might happen 1 0 0  Total GAD 7 Score 4 1 3   Anxiety Difficulty Somewhat difficult Not difficult at all -      All questions at time of visit were answered - patient instructed to contact office with any additional concerns or updates.  ER/RTC precautions were reviewed with the patient.  Please note: voice recognition software was used to produce this document, and typos may escape review. Please contact Dr. Sheppard Coil for any needed clarifications.

## 2019-06-30 ENCOUNTER — Encounter: Payer: Self-pay | Admitting: Osteopathic Medicine

## 2019-06-30 LAB — COMPLETE METABOLIC PANEL WITH GFR
AG Ratio: 1.8 (calc) (ref 1.0–2.5)
ALT: 21 U/L (ref 6–29)
AST: 16 U/L (ref 10–30)
Albumin: 4.4 g/dL (ref 3.6–5.1)
Alkaline phosphatase (APISO): 74 U/L (ref 31–125)
BUN: 16 mg/dL (ref 7–25)
CO2: 29 mmol/L (ref 20–32)
Calcium: 10 mg/dL (ref 8.6–10.2)
Chloride: 102 mmol/L (ref 98–110)
Creat: 0.71 mg/dL (ref 0.50–1.10)
GFR, Est African American: 124 mL/min/{1.73_m2} (ref >=60)
GFR, Est Non African American: 107 mL/min/{1.73_m2} (ref >=60)
Globulin: 2.4 g/dL (calc) (ref 1.9–3.7)
Glucose, Bld: 93 mg/dL (ref 65–99)
Potassium: 4.3 mmol/L (ref 3.5–5.3)
Sodium: 138 mmol/L (ref 135–146)
Total Bilirubin: 0.4 mg/dL (ref 0.2–1.2)
Total Protein: 6.8 g/dL (ref 6.1–8.1)

## 2019-06-30 LAB — CBC
HCT: 40.6 % (ref 35.0–45.0)
Hemoglobin: 13.6 g/dL (ref 11.7–15.5)
MCH: 31 pg (ref 27.0–33.0)
MCHC: 33.5 g/dL (ref 32.0–36.0)
MCV: 92.5 fL (ref 80.0–100.0)
MPV: 9.6 fL (ref 7.5–12.5)
Platelets: 366 10*3/uL (ref 140–400)
RBC: 4.39 10*6/uL (ref 3.80–5.10)
RDW: 12.6 % (ref 11.0–15.0)
WBC: 8.6 10*3/uL (ref 3.8–10.8)

## 2019-06-30 LAB — LIPID PANEL
Cholesterol: 229 mg/dL — ABNORMAL HIGH (ref <200)
HDL: 44 mg/dL — ABNORMAL LOW (ref >=50)
LDL Cholesterol (Calc): 143 mg/dL (calc) — ABNORMAL HIGH
Non-HDL Cholesterol (Calc): 185 mg/dL (calc) — ABNORMAL HIGH (ref <130)
Total CHOL/HDL Ratio: 5.2 (calc) — ABNORMAL HIGH (ref <5.0)
Triglycerides: 275 mg/dL — ABNORMAL HIGH (ref <150)

## 2019-06-30 LAB — TSH: TSH: 3.45 mIU/L

## 2019-06-30 MED ORDER — PROPRANOLOL HCL ER 80 MG PO CP24: 80.0000 mg | ORAL_CAPSULE | Freq: Every day | ORAL | 3 refills | Status: DC

## 2019-06-30 MED ORDER — IRBESARTAN 300 MG PO TABS: 150.0000 mg | ORAL_TABLET | Freq: Every day | ORAL | 1 refills | Status: DC

## 2019-07-22 DIAGNOSIS — F411 Generalized anxiety disorder: Secondary | ICD-10-CM | POA: Diagnosis not present

## 2019-08-06 DIAGNOSIS — F332 Major depressive disorder, recurrent severe without psychotic features: Secondary | ICD-10-CM | POA: Diagnosis not present

## 2019-08-06 DIAGNOSIS — F411 Generalized anxiety disorder: Secondary | ICD-10-CM | POA: Diagnosis not present

## 2019-08-07 DIAGNOSIS — F332 Major depressive disorder, recurrent severe without psychotic features: Secondary | ICD-10-CM | POA: Diagnosis not present

## 2019-08-07 DIAGNOSIS — F411 Generalized anxiety disorder: Secondary | ICD-10-CM | POA: Diagnosis not present

## 2019-08-08 DIAGNOSIS — F332 Major depressive disorder, recurrent severe without psychotic features: Secondary | ICD-10-CM | POA: Diagnosis not present

## 2019-08-08 DIAGNOSIS — F411 Generalized anxiety disorder: Secondary | ICD-10-CM | POA: Diagnosis not present

## 2019-08-09 DIAGNOSIS — F332 Major depressive disorder, recurrent severe without psychotic features: Secondary | ICD-10-CM | POA: Diagnosis not present

## 2019-08-09 DIAGNOSIS — F411 Generalized anxiety disorder: Secondary | ICD-10-CM | POA: Diagnosis not present

## 2019-08-10 DIAGNOSIS — F332 Major depressive disorder, recurrent severe without psychotic features: Secondary | ICD-10-CM | POA: Diagnosis not present

## 2019-08-11 DIAGNOSIS — F411 Generalized anxiety disorder: Secondary | ICD-10-CM | POA: Diagnosis not present

## 2019-08-11 DIAGNOSIS — F332 Major depressive disorder, recurrent severe without psychotic features: Secondary | ICD-10-CM | POA: Diagnosis not present

## 2019-08-14 DIAGNOSIS — F332 Major depressive disorder, recurrent severe without psychotic features: Secondary | ICD-10-CM | POA: Diagnosis not present

## 2019-08-16 DIAGNOSIS — F332 Major depressive disorder, recurrent severe without psychotic features: Secondary | ICD-10-CM | POA: Diagnosis not present

## 2019-08-16 DIAGNOSIS — F411 Generalized anxiety disorder: Secondary | ICD-10-CM | POA: Diagnosis not present

## 2019-08-17 DIAGNOSIS — F332 Major depressive disorder, recurrent severe without psychotic features: Secondary | ICD-10-CM | POA: Diagnosis not present

## 2019-08-17 DIAGNOSIS — F411 Generalized anxiety disorder: Secondary | ICD-10-CM | POA: Diagnosis not present

## 2019-08-18 DIAGNOSIS — F332 Major depressive disorder, recurrent severe without psychotic features: Secondary | ICD-10-CM | POA: Diagnosis not present

## 2019-08-21 DIAGNOSIS — F332 Major depressive disorder, recurrent severe without psychotic features: Secondary | ICD-10-CM | POA: Diagnosis not present

## 2019-08-21 DIAGNOSIS — F411 Generalized anxiety disorder: Secondary | ICD-10-CM | POA: Diagnosis not present

## 2019-08-24 DIAGNOSIS — F411 Generalized anxiety disorder: Secondary | ICD-10-CM | POA: Diagnosis not present

## 2019-08-24 DIAGNOSIS — F332 Major depressive disorder, recurrent severe without psychotic features: Secondary | ICD-10-CM | POA: Diagnosis not present

## 2019-08-25 DIAGNOSIS — F411 Generalized anxiety disorder: Secondary | ICD-10-CM | POA: Diagnosis not present

## 2019-08-25 DIAGNOSIS — F332 Major depressive disorder, recurrent severe without psychotic features: Secondary | ICD-10-CM | POA: Diagnosis not present

## 2019-08-28 DIAGNOSIS — F332 Major depressive disorder, recurrent severe without psychotic features: Secondary | ICD-10-CM | POA: Diagnosis not present

## 2019-08-28 DIAGNOSIS — F411 Generalized anxiety disorder: Secondary | ICD-10-CM | POA: Diagnosis not present

## 2019-08-29 DIAGNOSIS — F332 Major depressive disorder, recurrent severe without psychotic features: Secondary | ICD-10-CM | POA: Diagnosis not present

## 2019-08-29 DIAGNOSIS — F411 Generalized anxiety disorder: Secondary | ICD-10-CM | POA: Diagnosis not present

## 2019-08-31 DIAGNOSIS — F332 Major depressive disorder, recurrent severe without psychotic features: Secondary | ICD-10-CM | POA: Diagnosis not present

## 2019-09-01 DIAGNOSIS — F332 Major depressive disorder, recurrent severe without psychotic features: Secondary | ICD-10-CM | POA: Diagnosis not present

## 2019-09-04 DIAGNOSIS — F332 Major depressive disorder, recurrent severe without psychotic features: Secondary | ICD-10-CM | POA: Diagnosis not present

## 2019-09-30 DIAGNOSIS — F411 Generalized anxiety disorder: Secondary | ICD-10-CM | POA: Diagnosis not present

## 2019-10-05 ENCOUNTER — Other Ambulatory Visit (HOSPITAL_BASED_OUTPATIENT_CLINIC_OR_DEPARTMENT_OTHER): Payer: Self-pay

## 2019-10-05 DIAGNOSIS — G4733 Obstructive sleep apnea (adult) (pediatric): Secondary | ICD-10-CM

## 2019-10-14 ENCOUNTER — Encounter: Payer: Self-pay | Admitting: Osteopathic Medicine

## 2019-10-14 ENCOUNTER — Other Ambulatory Visit: Payer: Self-pay | Admitting: Physician Assistant

## 2019-10-14 DIAGNOSIS — I10 Essential (primary) hypertension: Secondary | ICD-10-CM

## 2019-10-16 ENCOUNTER — Other Ambulatory Visit: Payer: Self-pay

## 2019-10-16 DIAGNOSIS — I10 Essential (primary) hypertension: Secondary | ICD-10-CM

## 2019-10-16 MED ORDER — IRBESARTAN 300 MG PO TABS: 150.0000 mg | ORAL_TABLET | Freq: Every day | ORAL | 1 refills | Status: DC

## 2019-11-09 DIAGNOSIS — F330 Major depressive disorder, recurrent, mild: Secondary | ICD-10-CM | POA: Diagnosis not present

## 2019-11-23 DIAGNOSIS — F330 Major depressive disorder, recurrent, mild: Secondary | ICD-10-CM | POA: Diagnosis not present

## 2019-11-30 DIAGNOSIS — F330 Major depressive disorder, recurrent, mild: Secondary | ICD-10-CM | POA: Diagnosis not present

## 2019-12-07 DIAGNOSIS — F330 Major depressive disorder, recurrent, mild: Secondary | ICD-10-CM | POA: Diagnosis not present

## 2019-12-14 DIAGNOSIS — F330 Major depressive disorder, recurrent, mild: Secondary | ICD-10-CM | POA: Diagnosis not present

## 2019-12-18 ENCOUNTER — Other Ambulatory Visit: Payer: Self-pay

## 2019-12-18 ENCOUNTER — Ambulatory Visit (HOSPITAL_BASED_OUTPATIENT_CLINIC_OR_DEPARTMENT_OTHER): Payer: BC Managed Care – PPO | Admitting: Internal Medicine

## 2019-12-21 DIAGNOSIS — F330 Major depressive disorder, recurrent, mild: Secondary | ICD-10-CM | POA: Diagnosis not present

## 2019-12-26 ENCOUNTER — Ambulatory Visit (HOSPITAL_BASED_OUTPATIENT_CLINIC_OR_DEPARTMENT_OTHER): Payer: BC Managed Care – PPO | Attending: Psychiatry | Admitting: Internal Medicine

## 2019-12-26 ENCOUNTER — Other Ambulatory Visit: Payer: Self-pay

## 2019-12-26 VITALS — Ht 67.0 in | Wt 275.0 lb

## 2019-12-26 DIAGNOSIS — G4733 Obstructive sleep apnea (adult) (pediatric): Secondary | ICD-10-CM | POA: Diagnosis not present

## 2020-01-04 DIAGNOSIS — F330 Major depressive disorder, recurrent, mild: Secondary | ICD-10-CM | POA: Diagnosis not present

## 2020-01-06 DIAGNOSIS — G4733 Obstructive sleep apnea (adult) (pediatric): Secondary | ICD-10-CM | POA: Diagnosis not present

## 2020-01-06 NOTE — Procedures (Signed)
    Patient Name: Debra Andrade, Debra Andrade Date: 12/26/2019 Gender: Female D.O.B: [birth year] Age (years): 39 Referring Provider: Angus Palms Height (inches): 67 Interpreting Physician: Jetty Duhamel MD, ABSM Weight (lbs): 275 RPSGT:  Sink BMI: 43 MRN: [id number] Neck Size: 17.00  CLINICAL INFORMATION Sleep Study Type: HST Indication for sleep study: OSA Epworth Sleepiness Score: 7  SLEEP STUDY TECHNIQUE A multi-channel overnight portable sleep study was performed. The channels recorded were: nasal airflow, thoracic respiratory movement, and oxygen saturation with a pulse oximetry. Snoring was also monitored.  MEDICATIONS Patient self administered medications include: none reported.  SLEEP ARCHITECTURE Patient was studied for 519.7 minutes. The sleep efficiency was 100.0 % and the patient was supine for 29.7%. The arousal index was 0.0 per hour.  RESPIRATORY PARAMETERS The overall AHI was 33.1 per hour, with a central apnea index of 0.0 per hour. The oxygen nadir was 83% during sleep.  CARDIAC DATA Mean heart rate during sleep was 68.2 bpm.  IMPRESSIONS - Severe obstructive sleep apnea occurred during this study (AHI = 33.1/h). - No significant central sleep apnea occurred during this study (CAI = 0.0/h). - Moderate oxygen desaturation was noted during this study (Min O2 = 83%). Mean sat 94% - Patient snored.  DIAGNOSIS - Obstructive Sleep Apnea (G47.33)  RECOMMENDATIONS - Suggest CPAP titration sleep study or autopap. Other options would be based on clinical judgment. - Be careful with alcohol, sedatives and other CNS depressants that may worsen sleep apnea and disrupt normal sleep architecture. - Sleep hygiene should be reviewed to assess factors that may improve sleep quality. - Weight management and regular exercise should be initiated or continued.  [Electronically signed] 01/06/2020 11:33 AM  Jetty Duhamel MD, ABSM Diplomate, American Board of  Sleep Medicine   NPI: [id number]                         Jetty Duhamel Diplomate, American Board of Sleep Medicine  ELECTRONICALLY SIGNED ON:  01/06/2020, 11:13 AM [facility] PH: [phone]   FX: [phone] ACCREDITED BY THE AMERICAN ACADEMY OF SLEEP MEDICINE

## 2020-01-11 DIAGNOSIS — F330 Major depressive disorder, recurrent, mild: Secondary | ICD-10-CM | POA: Diagnosis not present

## 2020-01-18 DIAGNOSIS — F330 Major depressive disorder, recurrent, mild: Secondary | ICD-10-CM | POA: Diagnosis not present

## 2020-01-26 ENCOUNTER — Emergency Department (INDEPENDENT_AMBULATORY_CARE_PROVIDER_SITE_OTHER): Payer: BC Managed Care – PPO

## 2020-01-26 ENCOUNTER — Emergency Department (INDEPENDENT_AMBULATORY_CARE_PROVIDER_SITE_OTHER)
Admission: RE | Admit: 2020-01-26 | Discharge: 2020-01-26 | Disposition: A | Discharge status: Home / Self Care | Payer: BC Managed Care – PPO | Source: Ambulatory Visit | Attending: Family Medicine | Admitting: Family Medicine

## 2020-01-26 ENCOUNTER — Ambulatory Visit: Payer: Self-pay

## 2020-01-26 ENCOUNTER — Other Ambulatory Visit: Payer: Self-pay

## 2020-01-26 VITALS — BP 120/84 | HR 76 | Temp 98.8°F | Resp 16

## 2020-01-26 DIAGNOSIS — M25562 Pain in left knee: Secondary | ICD-10-CM

## 2020-01-26 NOTE — Discharge Instructions (Addendum)
Wear hinged knee brace until improved.  May take Tylenol as needed for pain.

## 2020-01-26 NOTE — ED Provider Notes (Signed)
[facility]    CSN: [id number] Arrival date & time: 01/26/20  1638      History   Chief Complaint Chief Complaint  Patient presents with  . Knee Pain    Left    HPI Debra Andrade is a 39 y.o. female.   Patient reports that she tripped going up some stairs about two weeks ago, although she did not have any pain at that time.  She subsequently developed vague pain in her left posterior knee that has persisted.  She has pain when fully extending her knee, and is uncomfortable when she fully flexes her knee.  She has pain when walking and climbing stairs.  She has not had much improvement while wearing a knee brace.  The history is provided by the patient.  Knee Pain Location:  Knee Time since incident:  2 weeks Injury: yes   Knee location:  L knee Pain details:    Quality:  Aching   Radiates to:  Does not radiate   Severity:  Mild   Onset quality:  Gradual   Duration:  2 weeks   Timing:  Constant   Progression:  Unchanged Chronicity:  New Prior injury to area:  No Relieved by:  Nothing Exacerbated by: walking. Ineffective treatments: knee brace. Associated symptoms: no decreased ROM, no fatigue, no fever, no muscle weakness, no stiffness and no swelling     Past Medical History:  Diagnosis Date  . Anxiety   . Bipolar affective (HCC)   . Depression   . Family history of adverse reaction to anesthesia    father had hypotension with hip replacement surgery  . GERD (gastroesophageal reflux disease)   . History of kidney stones   . Hypertension   . Obesity     Patient Active Problem List   Diagnosis Date Noted  . History of cardiovascular stress test 09/08/2018  . Chest pain 03/13/2018  . Sinus tachycardia 03/13/2018  . Hypertension goal BP (blood pressure) < 130/80 03/13/2018  . Mood disorder (HCC) 03/13/2018  . Persistent lymphocytosis 02/17/2018  . Uncontrolled hypertension 02/10/2018  . Class 3 severe obesity due to excess calories with  serious comorbidity and body mass index (BMI) of 40.0 to 44.9 in adult (HCC) 02/10/2018  . Irregular menses 02/10/2018  . Fatigue 02/10/2018  . Elevated blood pressure reading 05/31/2017  . Loose stools 05/31/2017  . Anxiety disorder 05/31/2017  . Gastroesophageal reflux disease 05/31/2017    Past Surgical History:  Procedure Laterality Date  . EXTRACORPOREAL SHOCK WAVE LITHOTRIPSY Left 03/02/2019   Procedure: EXTRACORPOREAL SHOCK WAVE LITHOTRIPSY (ESWL);  Surgeon: Jerilee Field, MD;  Location: [facility];  Service: Urology;  Laterality: Left;  . WISDOM TOOTH EXTRACTION      OB History    Gravida  1   Para  0   Term      Preterm      AB  1   Living        SAB      TAB      Ectopic      Multiple      Live Births               Home Medications    Prior to Admission medications   Medication Sig Start Date End Date Taking? Authorizing Provider  brexpiprazole (REXULTI) 1 MG TABS tablet Take by mouth daily.    [provider]  Cranberry 360 MG CAPS Take by mouth.    [provider]  cyanocobalamin 500 MCG tablet Take 500 mcg by mouth daily.    [provider]  FLUoxetine (PROZAC) 40 MG capsule Take 40 mg by mouth daily.    [provider]  irbesartan (AVAPRO) 300 MG tablet Take 0.5 tablets (150 mg total) by mouth daily. 10/16/19   Sunnie Nielsen, DO  loratadine (CLARITIN) 10 MG tablet Take 10 mg by mouth daily.    [provider]  omeprazole (PRILOSEC) 20 MG capsule Take 20 mg by mouth daily.    [provider]  ondansetron (ZOFRAN) 4 MG tablet Take 4 mg by mouth as needed for nausea or vomiting.    [provider]  propranolol ER (INDERAL LA) 80 MG 24 hr capsule Take 1 capsule (80 mg total) by mouth daily. 06/30/19   Sunnie Nielsen, DO    Family History Family History  Problem Relation Age of Onset  . Kidney disease Mother   . Barrett's esophagus Father   . Esophageal  cancer Father   . Hypertension Father   . Hyperlipidemia Father   . Bipolar disorder Brother     Social History Social History   Tobacco Use  . Smoking status: Never Smoker  . Smokeless tobacco: Never Used  Vaping Use  . Vaping Use: Never used  Substance Use Topics  . Alcohol use: Yes    Alcohol/week: 1.0 standard drink    Types: 1 Standard drinks or equivalent per week    Comment: monthly  . Drug use: No     Allergies   Metformin and related and Amoxicillin   Review of Systems Review of Systems  Constitutional: Negative for activity change, chills, diaphoresis, fatigue and fever.  Respiratory: Negative for chest tightness and shortness of breath.   Cardiovascular: Negative for chest pain.  Musculoskeletal: Negative for joint swelling and stiffness.  Skin: Negative for rash.  All other systems reviewed and are negative.    Physical Exam Triage Vital Signs ED Triage Vitals  Enc Vitals Group     BP 01/26/20 1654 (!) 141/100     Pulse Rate 01/26/20 1654 76     Resp 01/26/20 1654 16     Temp 01/26/20 1654 98.8 F (37.1 C)     Temp Source 01/26/20 1654 Oral     SpO2 01/26/20 1654 97 %     Weight --      Height --      Head Circumference --      Peak Flow --      Pain Score 01/26/20 1650 2     Pain Loc --      Pain Edu? --      Excl. in GC? --    No data found.  Updated Vital Signs BP 120/84 (BP Location: Right Arm)   Pulse 76   Temp 98.8 F (37.1 C) (Oral)   Resp 16   LMP 01/14/2020 (Approximate)   SpO2 97%   Visual Acuity Right Eye Distance:   Left Eye Distance:   Bilateral Distance:    Right Eye Near:   Left Eye Near:    Bilateral Near:     Physical Exam Vitals and nursing note reviewed.  Constitutional:      General: She is not in acute distress. HENT:     Head: Normocephalic.  Eyes:     Pupils: Pupils are equal, round, and reactive to light.  Cardiovascular:     Rate and Rhythm: Normal rate.  Pulmonary:     Effort: Pulmonary  effort is normal.  Musculoskeletal:     Cervical back: Normal range of motion.     Left knee: No swelling, effusion, erythema or crepitus. Normal range of motion. Tenderness present. No LCL laxity, MCL laxity, ACL laxity or PCL laxity.Normal alignment, normal meniscus and normal patellar mobility. Normal pulse.     Comments: Right knee:  No effusion, erythema, or warmth.  Knee stable, negative drawer test.  McMurray test negative.  There is vague tenderness to palpation in the popliteal fossa without swelling.  Skin:    General: Skin is warm and dry.     Findings: No rash.  Neurological:     Mental Status: She is alert.      UC Treatments / Results  Labs (all labs ordered are listed, but only abnormal results are displayed) Labs Reviewed - No data to display  EKG   Radiology DG Knee Complete 4 Views Left  Result Date: 01/26/2020 CLINICAL DATA:  Pain status post fall EXAM: LEFT KNEE - COMPLETE 4+ VIEW COMPARISON:  None. FINDINGS: No evidence of fracture, dislocation, or joint effusion. No evidence of arthropathy or other focal bone abnormality. Soft tissues are unremarkable. IMPRESSION: Negative. Electronically Signed   By: Katherine Mantle M.D.   On: 01/26/2020 17:36    Procedures Procedures (including critical care time)  Medications Ordered in UC Medications - No data to display  Initial Impression / Assessment and Plan / UC Course  I have reviewed the triage vital signs and the nursing notes.  Pertinent labs & imaging results that were available during my care of the patient were reviewed by me and considered in my medical decision making (see chart for details).    Non-specific posterior knee pain; ?flexor tendonitis.  Does not appear to be Baker's cyst.  Dispensed hinged knee brace. Followup with sports medicine if not improved about 3 weeks.   Final Clinical Impressions(s) / UC Diagnoses   Final diagnoses:  Acute pain of left knee     Discharge Instructions      Wear hinged knee brace until improved.  May take Tylenol as needed for pain.    ED Prescriptions    None        Lattie Haw, MD 01/29/20 1007

## 2020-01-26 NOTE — ED Triage Notes (Signed)
Pt presents to Urgent Care with c/o posterior L knee pain x 2 weeks. Pt states she tripped going up the stairs approx 2 weeks ago, but she didn't feel pain at this time. Has been using a knee brace, but it hasn't helped w/ pain.

## 2020-02-01 DIAGNOSIS — F330 Major depressive disorder, recurrent, mild: Secondary | ICD-10-CM | POA: Diagnosis not present

## 2020-02-07 DIAGNOSIS — F330 Major depressive disorder, recurrent, mild: Secondary | ICD-10-CM | POA: Diagnosis not present

## 2020-05-20 ENCOUNTER — Other Ambulatory Visit (HOSPITAL_BASED_OUTPATIENT_CLINIC_OR_DEPARTMENT_OTHER): Payer: Self-pay

## 2020-05-20 DIAGNOSIS — G4733 Obstructive sleep apnea (adult) (pediatric): Secondary | ICD-10-CM

## 2020-07-26 ENCOUNTER — Encounter (HOSPITAL_BASED_OUTPATIENT_CLINIC_OR_DEPARTMENT_OTHER): Payer: BC Managed Care – PPO | Admitting: Internal Medicine

## 2020-08-20 ENCOUNTER — Encounter: Payer: Self-pay | Admitting: Osteopathic Medicine

## 2020-08-20 ENCOUNTER — Ambulatory Visit (INDEPENDENT_AMBULATORY_CARE_PROVIDER_SITE_OTHER): Payer: 59 | Admitting: Osteopathic Medicine

## 2020-08-20 ENCOUNTER — Other Ambulatory Visit: Payer: Self-pay

## 2020-08-20 VITALS — BP 143/71 | HR 74 | Temp 98.4°F | Wt 270.0 lb

## 2020-08-20 DIAGNOSIS — Z5181 Encounter for therapeutic drug level monitoring: Secondary | ICD-10-CM

## 2020-08-20 DIAGNOSIS — R000 Tachycardia, unspecified: Secondary | ICD-10-CM

## 2020-08-20 DIAGNOSIS — I10 Essential (primary) hypertension: Secondary | ICD-10-CM

## 2020-08-20 DIAGNOSIS — Z0000 Encounter for general adult medical examination without abnormal findings: Secondary | ICD-10-CM

## 2020-08-20 DIAGNOSIS — Z1231 Encounter for screening mammogram for malignant neoplasm of breast: Secondary | ICD-10-CM

## 2020-08-20 MED ORDER — PROPRANOLOL HCL ER 80 MG PO CP24: 80.0000 mg | ORAL_CAPSULE | Freq: Every day | ORAL | 3 refills | Status: DC

## 2020-08-20 MED ORDER — FAMOTIDINE 20 MG PO TABS: 20.0000 mg | ORAL_TABLET | Freq: Every day | ORAL | 3 refills | Status: DC

## 2020-08-20 MED ORDER — IRBESARTAN 300 MG PO TABS: 150.0000 mg | ORAL_TABLET | Freq: Every day | ORAL | 1 refills | Status: DC

## 2020-08-20 MED ORDER — OMEPRAZOLE 40 MG PO CPDR: 40.0000 mg | DELAYED_RELEASE_CAPSULE | Freq: Every day | ORAL | 1 refills | Status: DC

## 2020-08-20 NOTE — Patient Instructions (Addendum)
General Preventive Care  Most recent routine screening labs: ordered today.   Blood pressure goal 130/80 or less.   Tobacco: don't!   Alcohol: responsible moderation is ok for most adults - if you have concerns about your alcohol intake, please talk to me!   Exercise: as tolerated to reduce risk of cardiovascular disease and diabetes. Strength training will also prevent osteoporosis.   Mental health: if need for mental health care (medicines, counseling, other), or concerns about moods, please let me know!   Sexual / Reproductive health: if need for STD testing, or if concerns with libido/pain problems, please let me know! If you need to discuss family planning, please let me know!   Advanced Directive: Living Will and/or Healthcare Power of Attorney recommended for all adults, regardless of age or health.  Vaccines  Flu vaccine: recommended every fall.   Shingles vaccine: after age 41.   Pneumonia vaccines: after age 98  Tetanus booster: every 10 years - due 2028 / 3rd trimester of pregnancy  HPV vaccine: Gardasil up to age 54 optional. People who are monogamous/not sexually active and no history of abnormal Pap are low risk for HPV.    COVID vaccine: THANKS for getting your vaccine! :) Boosters are STRONGLY recommended  Cancer screenings   Colon cancer screening: for everyone age 68-75. Colonoscopy available for all, many people also qualify for the Cologuard stool test   Breast cancer screening: mammogram at age 1 every other year at least, and annually after age 53. Ordered.   Cervical cancer screening: Pap due 12/2022 (every 5 years if normal).   Lung cancer screening: not needed for non-smokers  Infection screenings  . HIV: recommended screening at least once age 65-65, more often as needed. . Gonorrhea/Chlamydia: screening as needed . Hepatitis C: recommended once for everyone age 27-75 . TB: certain at-risk populations, or depending on work requirements and/or  travel history Other . Bone Density Test: recommended for women at age 83

## 2020-08-20 NOTE — Progress Notes (Signed)
Debra Andrade is a 40 y.o. female who presents to  [facility] at [facility]  today, 08/20/20, seeking care for the following:  . Annual physical  . Monitor HTN - reports home BP consistently 120s/70s - 120s/80s, no CP/SOB, no HA/VC, no palpitations   BP Readings from Last 3 Encounters:  08/20/20 (!) 143/71  01/26/20 120/84  06/29/19 135/84     ASSESSMENT & PLAN with other pertinent findings:  The primary encounter diagnosis was Annual physical exam. Diagnoses of Hypertension goal BP (blood pressure) < 130/80, Medication monitoring encounter, Breast cancer screening by mammogram, and Sinus tachycardia were also pertinent to this visit.    Patient Instructions  General Preventive Care  Most recent routine screening labs: ordered today.   Blood pressure goal 130/80 or less.   Tobacco: don't!   Alcohol: responsible moderation is ok for most adults - if you have concerns about your alcohol intake, please talk to me!   Exercise: as tolerated to reduce risk of cardiovascular disease and diabetes. Strength training will also prevent osteoporosis.   Mental health: if need for mental health care (medicines, counseling, other), or concerns about moods, please let me know!   Sexual / Reproductive health: if need for STD testing, or if concerns with libido/pain problems, please let me know! If you need to discuss family planning, please let me know!   Advanced Directive: Living Will and/or Healthcare Power of Attorney recommended for all adults, regardless of age or health.  Vaccines  Flu vaccine: recommended every fall.   Shingles vaccine: after age 60.   Pneumonia vaccines: after age 22  Tetanus booster: every 10 years - due 2028 / 3rd trimester of pregnancy  HPV vaccine: Gardasil up to age 2 optional. People who are monogamous/not sexually active and no history of abnormal Pap are low risk for HPV.    COVID vaccine: THANKS for  getting your vaccine! :) Boosters are STRONGLY recommended  Cancer screenings   Colon cancer screening: for everyone age 73-75. Colonoscopy available for all, many people also qualify for the Cologuard stool test   Breast cancer screening: mammogram at age 83 every other year at least, and annually after age 97. Ordered.   Cervical cancer screening: Pap due 12/2022 (every 5 years if normal).   Lung cancer screening: not needed for non-smokers  Infection screenings  . HIV: recommended screening at least once age 66-65, more often as needed. . Gonorrhea/Chlamydia: screening as needed . Hepatitis C: recommended once for everyone age 2-75 . TB: certain at-risk populations, or depending on work requirements and/or travel history Other . Bone Density Test: recommended for women at age 13   Orders Placed This Encounter  Procedures  . MM 3D SCREEN BREAST BILATERAL  . CBC  . COMPLETE METABOLIC PANEL WITH GFR  . Lipid panel  . TSH    Meds ordered this encounter  Medications  . irbesartan (AVAPRO) 300 MG tablet    Sig: Take 0.5 tablets (150 mg total) by mouth daily.    Dispense:  90 tablet    Refill:  1  . propranolol ER (INDERAL LA) 80 MG 24 hr capsule    Sig: Take 1 capsule (80 mg total) by mouth daily.    Dispense:  90 capsule    Refill:  3  . omeprazole (PRILOSEC) 40 MG capsule    Sig: Take 1 capsule (40 mg total) by mouth daily.    Dispense:  45 capsule  Refill:  1  . famotidine (PEPCID) 20 MG tablet    Sig: Take 1 tablet (20 mg total) by mouth daily. (long term use for acid reflux)    Dispense:  90 tablet    Refill:  3     See below for relevant physical exam findings  See below for recent lab and imaging results reviewed  Medications, allergies, PMH, PSH, SocH, FamH reviewed below    Follow-up instructions: Return in about 1 year (around 08/20/2021) for [place] (call week prior to visit for lab  orders).                                        Exam:  BP (!) 143/71 (BP Location: Left Arm, Patient Position: Sitting, Cuff Size: Large)   Pulse 74   Temp 98.4 F (36.9 C) (Oral)   Wt 270 lb 0.6 oz (122.5 kg)   BMI 42.29 kg/m   Constitutional: VS see above. General Appearance: alert, well-developed, well-nourished, NAD  Neck: No masses, trachea midline.   Respiratory: Normal respiratory effort. no wheeze, no rhonchi, no rales  Cardiovascular: S1/S2 normal, no murmur, no rub/gallop auscultated. RRR.   Musculoskeletal: Gait normal. Symmetric and independent movement of all extremities  Abdominal: non-tender, non-distended, no appreciable organomegaly, neg Murphy's, BS WNLx4  Neurological: Normal balance/coordination. No tremor.  Skin: warm, dry, intact.   Psychiatric: Normal judgment/insight. Normal mood and affect. Oriented x3.   Current Meds  Medication Sig  . ARIPiprazole (ABILIFY) 10 MG tablet Take 10 mg by mouth daily.  . cetirizine (ZYRTEC) 10 MG tablet Take 10 mg by mouth daily.  . Cranberry 360 MG CAPS Take by mouth.  . cyanocobalamin 500 MCG tablet Take 500 mcg by mouth daily.  . famotidine (PEPCID) 20 MG tablet Take 1 tablet (20 mg total) by mouth daily. (long term use for acid reflux)  . FLUoxetine (PROZAC) 40 MG capsule Take 40 mg by mouth daily.  Marland Kitchen omeprazole (PRILOSEC) 40 MG capsule Take 1 capsule (40 mg total) by mouth daily.  . ondansetron (ZOFRAN) 4 MG tablet Take 4 mg by mouth as needed for nausea or vomiting.  . [DISCONTINUED] irbesartan (AVAPRO) 300 MG tablet Take 0.5 tablets (150 mg total) by mouth daily.  . [DISCONTINUED] omeprazole (PRILOSEC) 20 MG capsule Take 20 mg by mouth daily.  . [DISCONTINUED] propranolol ER (INDERAL LA) 80 MG 24 hr capsule Take 1 capsule (80 mg total) by mouth daily.    Allergies  Allergen Reactions  . Metformin And Related Diarrhea  . Amoxicillin Rash    Patient Active Problem List    Diagnosis Date Noted  . History of cardiovascular stress test 09/08/2018  . Chest pain 03/13/2018  . Sinus tachycardia 03/13/2018  . Hypertension goal BP (blood pressure) < 130/80 03/13/2018  . Mood disorder (HCC) 03/13/2018  . Persistent lymphocytosis 02/17/2018  . Uncontrolled hypertension 02/10/2018  . Class 3 severe obesity due to excess calories with serious comorbidity and body mass index (BMI) of 40.0 to 44.9 in adult (HCC) 02/10/2018  . Irregular menses 02/10/2018  . Fatigue 02/10/2018  . Elevated blood pressure reading 05/31/2017  . Loose stools 05/31/2017  . Anxiety disorder 05/31/2017  . Gastroesophageal reflux disease 05/31/2017    Family History  Problem Relation Age of Onset  . Kidney disease Mother   . Barrett's esophagus Father   . Esophageal cancer Father   . Hypertension Father   .  Hyperlipidemia Father   . Bipolar disorder Brother     Social History   Tobacco Use  Smoking Status Never Smoker  Smokeless Tobacco Never Used    Past Surgical History:  Procedure Laterality Date  . EXTRACORPOREAL SHOCK WAVE LITHOTRIPSY Left 03/02/2019   Procedure: EXTRACORPOREAL SHOCK WAVE LITHOTRIPSY (ESWL);  Surgeon: Jerilee Field, MD;  Location: [facility];  Service: Urology;  Laterality: Left;  . WISDOM TOOTH EXTRACTION      Immunization History  Administered Date(s) Administered  . Influenza,inj,Quad PF,6+ Mos 01/13/2018, 12/01/2018  . PFIZER(Purple Top)SARS-COV-2 Vaccination 06/23/2019  . Tdap 07/13/2016    No results found for this or any previous visit (from the past 2160 hour(s)).  No results found.     All questions at time of visit were answered - patient instructed to contact office with any additional concerns or updates. ER/RTC precautions were reviewed with the patient as applicable.   Please note: manual typing as well as voice recognition software may have been used to produce this document - typos may escape review. Please  contact Dr. Lyn Hollingshead for any needed clarifications.

## 2020-08-21 ENCOUNTER — Encounter: Payer: Self-pay | Admitting: Osteopathic Medicine

## 2020-08-23 LAB — COMPLETE METABOLIC PANEL WITH GFR
AG Ratio: 1.9 (calc) (ref 1.0–2.5)
ALT: 15 U/L (ref 6–29)
AST: 14 U/L (ref 10–30)
Albumin: 4.2 g/dL (ref 3.6–5.1)
Alkaline phosphatase (APISO): 70 U/L (ref 31–125)
BUN: 14 mg/dL (ref 7–25)
CO2: 25 mmol/L (ref 20–32)
Calcium: 8.9 mg/dL (ref 8.6–10.2)
Chloride: 105 mmol/L (ref 98–110)
Creat: 0.71 mg/dL (ref 0.50–1.10)
GFR, Est African American: 123 mL/min/{1.73_m2} (ref >=60)
GFR, Est Non African American: 107 mL/min/{1.73_m2} (ref >=60)
Globulin: 2.2 g/dL (calc) (ref 1.9–3.7)
Glucose, Bld: 103 mg/dL — ABNORMAL HIGH (ref 65–99)
Potassium: 4.1 mmol/L (ref 3.5–5.3)
Sodium: 139 mmol/L (ref 135–146)
Total Bilirubin: 0.4 mg/dL (ref 0.2–1.2)
Total Protein: 6.4 g/dL (ref 6.1–8.1)

## 2020-08-23 LAB — HEMOGLOBIN A1C W/OUT EAG: Hgb A1c MFr Bld: 5.3 % of total Hgb (ref <5.7)

## 2020-08-23 LAB — CBC
HCT: 40 % (ref 35.0–45.0)
Hemoglobin: 12.9 g/dL (ref 11.7–15.5)
MCH: 29.7 pg (ref 27.0–33.0)
MCHC: 32.3 g/dL (ref 32.0–36.0)
MCV: 92.2 fL (ref 80.0–100.0)
MPV: 9.6 fL (ref 7.5–12.5)
Platelets: 349 10*3/uL (ref 140–400)
RBC: 4.34 10*6/uL (ref 3.80–5.10)
RDW: 12.1 % (ref 11.0–15.0)
WBC: 6.9 10*3/uL (ref 3.8–10.8)

## 2020-08-23 LAB — LIPID PANEL
Cholesterol: 220 mg/dL — ABNORMAL HIGH (ref <200)
HDL: 45 mg/dL — ABNORMAL LOW (ref >=50)
LDL Cholesterol (Calc): 148 mg/dL — ABNORMAL HIGH
Non-HDL Cholesterol (Calc): 175 mg/dL — ABNORMAL HIGH (ref <130)
Total CHOL/HDL Ratio: 4.9 (calc) (ref <5.0)
Triglycerides: 142 mg/dL (ref <150)

## 2020-08-23 LAB — TSH: TSH: 4.45 m[IU]/L

## 2020-11-17 ENCOUNTER — Other Ambulatory Visit: Payer: Self-pay | Admitting: Osteopathic Medicine

## 2021-02-24 ENCOUNTER — Other Ambulatory Visit: Payer: Self-pay | Admitting: Osteopathic Medicine

## 2021-04-30 ENCOUNTER — Telehealth: Payer: BC Managed Care – PPO | Admitting: Nurse Practitioner

## 2021-04-30 DIAGNOSIS — J00 Acute nasopharyngitis [common cold]: Secondary | ICD-10-CM | POA: Diagnosis not present

## 2021-04-30 DIAGNOSIS — Z20822 Contact with and (suspected) exposure to covid-19: Secondary | ICD-10-CM | POA: Diagnosis not present

## 2021-04-30 NOTE — Patient Instructions (Signed)
Take meds as prescribed 2. Use a cool mist humidifier especially during the winter months and when heat has been humid. 3. Use saline nose sprays frequently 4. Saline irrigations of the nose can be very helpful if done frequently.  * 4X daily for 1 week*  * Use of a nettie pot can be helpful with this. Follow directions with this* 5. Drink plenty of fluids 6. Keep thermostat turn down low 7.For any cough or congestion- delsym or mucinex OTC 8. For fever or aces or pains- take tylenol or ibuprofen appropriate for age and weight.  * for fevers greater than 101 orally you may alternate ibuprofen and tylenol every  3 hours.

## 2021-04-30 NOTE — Progress Notes (Signed)
Virtual Visit Consent   Debra Andrade, you are scheduled for a virtual visit with Mary-Margaret Daphine Deutscher, FNP, a [facility] provider, today.     Just as with appointments in the office, your consent must be obtained to participate.  Your consent will be active for this visit and any virtual visit you may have with one of our providers in the next 365 days.     If you have a MyChart account, a copy of this consent can be sent to you electronically.  All virtual visits are billed to your insurance company just like a traditional visit in the office.    As this is a virtual visit, video technology does not allow for your provider to perform a traditional examination.  This may limit your provider's ability to fully assess your condition.  If your provider identifies any concerns that need to be evaluated in person or the need to arrange testing (such as labs, EKG, etc.), we will make arrangements to do so.     Although advances in technology are sophisticated, we cannot ensure that it will always work on either your end or our end.  If the connection with a video visit is poor, the visit may have to be switched to a telephone visit.  With either a video or telephone visit, we are not always able to ensure that we have a secure connection.     I need to obtain your verbal consent now.   Are you willing to proceed with your visit today? YES   Debra Andrade has provided verbal consent on 04/30/2021 for a virtual visit (video or telephone).   Mary-Margaret Daphine Deutscher, FNP   Date: 04/30/2021 8:55 AM   Virtual Visit via Video Note   I, Mary-Margaret Daphine Deutscher, connected with Debra Andrade ([id number], November 30, 1980) on 04/30/21 at  9:00 AM EST by a video-enabled telemedicine application and verified that I am speaking with the correct person using two identifiers.  Location: Patient: Virtual Visit Location Patient: Home Provider: Virtual Visit Location Provider: Mobile   I discussed the limitations of  evaluation and management by telemedicine and the availability of in person appointments. The patient expressed understanding and agreed to proceed.    History of Present Illness: Debra Andrade is a 41 y.o. who identifies as a female who was assigned female at birth, and is being seen today for just doe snot feel well.Marland Kitchen  HPI: Patient states that she stayed out of work yesterday because she is feeling fatigued. Feels flushed nd has some head congestion. Feels light headed on occasion.   Review of Systems  Constitutional:  Positive for malaise/fatigue. Negative for chills and fever.  HENT:  Positive for congestion.   Respiratory:  Positive for cough (slight).   Musculoskeletal:  Positive for back pain. Negative for myalgias.  Neurological:  Positive for dizziness (slight). Negative for headaches.   Problems:  Patient Active Problem List   Diagnosis Date Noted   History of cardiovascular stress test 09/08/2018   Chest pain 03/13/2018   Sinus tachycardia 03/13/2018   Hypertension goal BP (blood pressure) < 130/80 03/13/2018   Mood disorder (HCC) 03/13/2018   Persistent lymphocytosis 02/17/2018   Uncontrolled hypertension 02/10/2018   Class 3 severe obesity due to excess calories with serious comorbidity and body mass index (BMI) of 40.0 to 44.9 in adult (HCC) 02/10/2018   Irregular menses 02/10/2018   Fatigue 02/10/2018   Elevated blood pressure reading 05/31/2017   Loose stools 05/31/2017  Anxiety disorder 05/31/2017   Gastroesophageal reflux disease 05/31/2017    Allergies:  Allergies  Allergen Reactions   Metformin And Related Diarrhea   Amoxicillin Rash   Medications:  Current Outpatient Medications:    ARIPiprazole (ABILIFY) 10 MG tablet, Take 10 mg by mouth daily., Disp: , Rfl:    brexpiprazole (REXULTI) 1 MG TABS tablet, Take by mouth daily. (Patient not taking: Reported on 08/20/2020), Disp: , Rfl:    cetirizine (ZYRTEC) 10 MG tablet, Take 10 mg by mouth daily., Disp: ,  Rfl:    Cranberry 360 MG CAPS, Take by mouth., Disp: , Rfl:    cyanocobalamin 500 MCG tablet, Take 500 mcg by mouth daily., Disp: , Rfl:    famotidine (PEPCID) 20 MG tablet, Take 1 tablet (20 mg total) by mouth daily. (long term use for acid reflux), Disp: 90 tablet, Rfl: 3   FLUoxetine (PROZAC) 40 MG capsule, Take 40 mg by mouth daily., Disp: , Rfl:    irbesartan (AVAPRO) 300 MG tablet, Take 0.5 tablets (150 mg total) by mouth daily., Disp: 90 tablet, Rfl: 1   loratadine (CLARITIN) 10 MG tablet, Take 10 mg by mouth daily. (Patient not taking: Reported on 08/20/2020), Disp: , Rfl:    omeprazole (PRILOSEC) 40 MG capsule, TAKE ONE CAPSULE BY MOUTH DAILY, Disp: 90 capsule, Rfl: 0   ondansetron (ZOFRAN) 4 MG tablet, Take 4 mg by mouth as needed for nausea or vomiting., Disp: , Rfl:    propranolol ER (INDERAL LA) 80 MG 24 hr capsule, Take 1 capsule (80 mg total) by mouth daily., Disp: 90 capsule, Rfl: 3  Observations/Objective: Patient is well-developed, well-nourished in no acute distress.  Resting comfortably  at home.  Head is normocephalic, atraumatic.  No labored breathing.  Speech is clear and coherent with logical content.  Patient is alert and oriented at baseline.    Assessment and Plan:  Debra Andrade in today with chief complaint of Fatigue   1. Acute nasopharyngitis 1. Take meds as prescribed 2. Use a cool mist humidifier especially during the winter months and when heat has been humid. 3. Use saline nose sprays frequently 4. Saline irrigations of the nose can be very helpful if done frequently.  * 4X daily for 1 week*  * Use of a nettie pot can be helpful with this. Follow directions with this* 5. Drink plenty of fluids 6. Keep thermostat turn down low 7.For any cough or congestion- delsym or mucinex OTC 8. For fever or aces or pains- take tylenol or ibuprofen appropriate for age and weight.  * for fevers greater than 101 orally you may alternate ibuprofen and tylenol every   3 hours.       Follow Up Instructions: I discussed the assessment and treatment plan with the patient. The patient was provided an opportunity to ask questions and all were answered. The patient agreed with the plan and demonstrated an understanding of the instructions.  A copy of instructions were sent to the patient via MyChart.  The patient was advised to call back or seek an in-person evaluation if the symptoms worsen or if the condition fails to improve as anticipated.  Time:  I spent 10 minutes with the patient via telehealth technology discussing the above problems/concerns.    Mary-Margaret Daphine Deutscher, FNP

## 2021-05-23 ENCOUNTER — Other Ambulatory Visit: Payer: Self-pay | Admitting: Neurology

## 2021-05-23 MED ORDER — OMEPRAZOLE 40 MG PO CPDR: 40.0000 mg | DELAYED_RELEASE_CAPSULE | Freq: Every day | ORAL | 0 refills | Status: DC

## 2021-06-22 ENCOUNTER — Other Ambulatory Visit: Payer: Self-pay | Admitting: Medical-Surgical

## 2021-07-21 ENCOUNTER — Other Ambulatory Visit: Payer: Self-pay | Admitting: Medical-Surgical

## 2021-08-17 ENCOUNTER — Other Ambulatory Visit: Payer: Self-pay | Admitting: Medical-Surgical

## 2021-08-21 ENCOUNTER — Other Ambulatory Visit: Payer: Self-pay | Admitting: Osteopathic Medicine

## 2021-08-21 DIAGNOSIS — I10 Essential (primary) hypertension: Secondary | ICD-10-CM

## 2021-08-21 DIAGNOSIS — R000 Tachycardia, unspecified: Secondary | ICD-10-CM

## 2021-08-22 ENCOUNTER — Other Ambulatory Visit: Payer: Self-pay | Admitting: Medical-Surgical

## 2021-08-22 ENCOUNTER — Other Ambulatory Visit: Payer: Self-pay | Admitting: Osteopathic Medicine

## 2021-08-22 DIAGNOSIS — I10 Essential (primary) hypertension: Secondary | ICD-10-CM

## 2021-08-22 NOTE — Telephone Encounter (Signed)
Patient has a TOC/Physical scheduled with Joy next week.A MUCK

## 2021-08-22 NOTE — Telephone Encounter (Signed)
Please call pt to schedule appt.  No further refills until pt is seen.  T. Talita Recht, CMA  

## 2021-08-24 IMAGING — CT CT RENAL STONE PROTOCOL
2 of 4 series · 16 of 46 positions shown, 18 images · non-contrast
Comparison: None.

CLINICAL DATA: Left flank pain with microscopic hematuria

EXAM:
CT ABDOMEN AND PELVIS WITHOUT CONTRAST
TECHNIQUE: Multidetector CT imaging of the abdomen and pelvis was performed
following the standard protocol without oral or IV contrast.

[Series 2: axial st · axial · 0.98mm/px · z∈[-576,-56]mm · 13 of 114 slices shown, 15 images]
[im 5/114  soft-tissue]
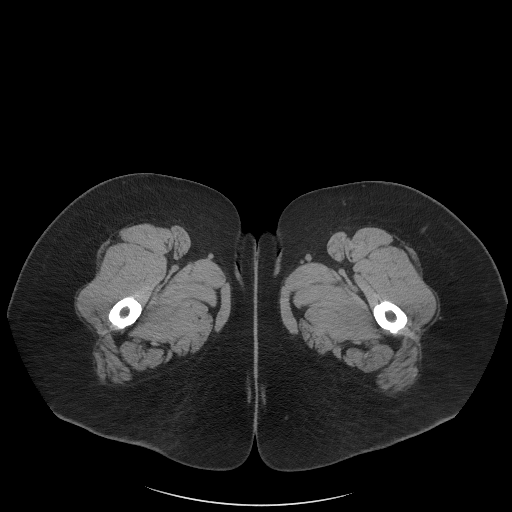
[im 5/114  bone]
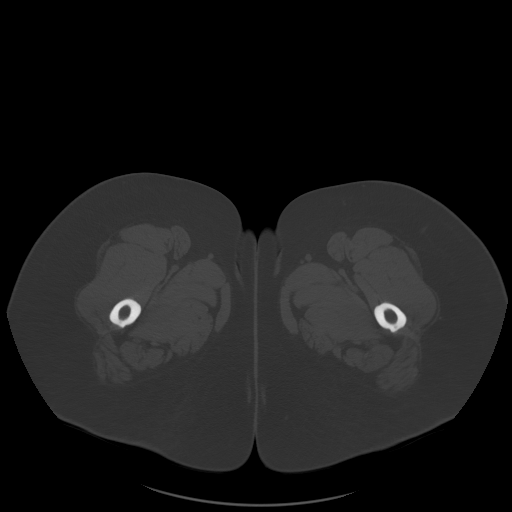
[im 15/114  soft-tissue]
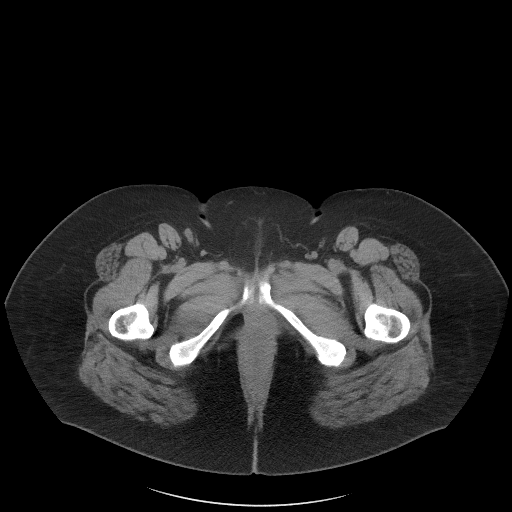
[im 24/114  soft-tissue]
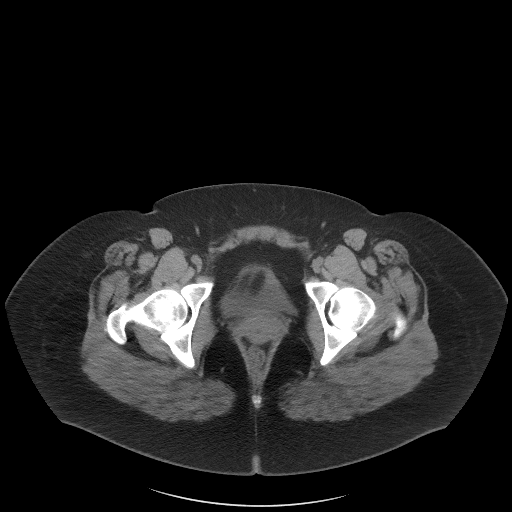
[im 33/114  soft-tissue]
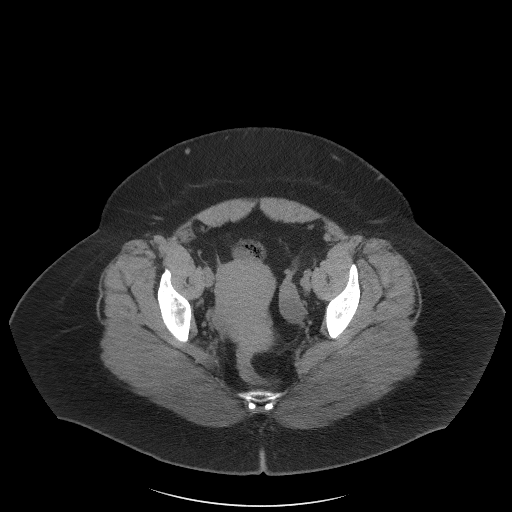
[im 38/114  soft-tissue]
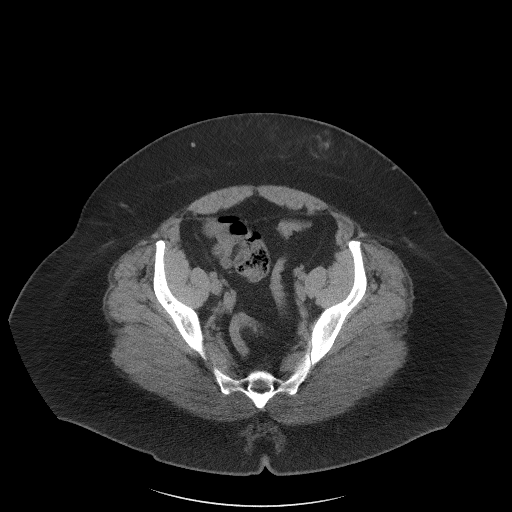
[im 48/114  soft-tissue]
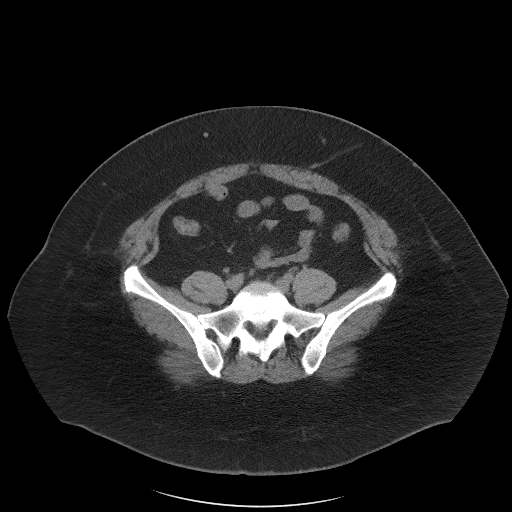
[im 57/114  soft-tissue]
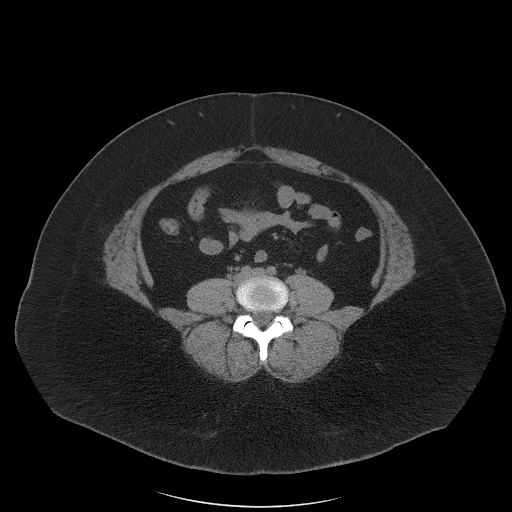
[im 66/114  soft-tissue]
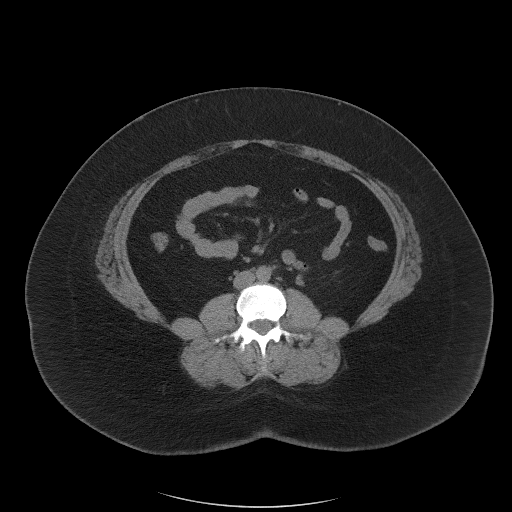
[im 76/114  soft-tissue]
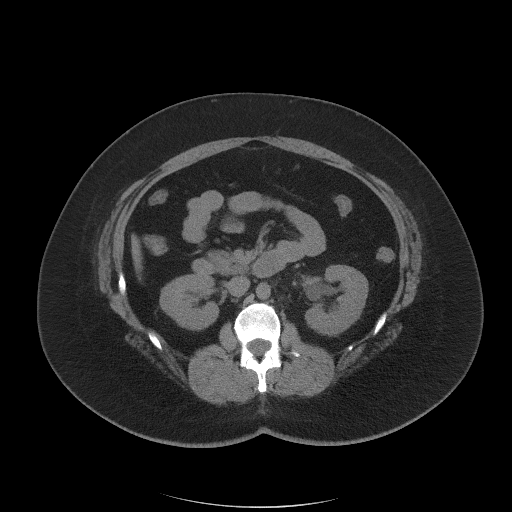
[im 76/114  bone]
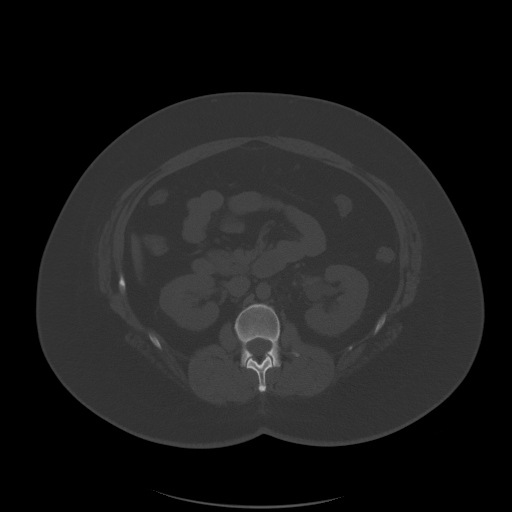
[im 81/114  soft-tissue]
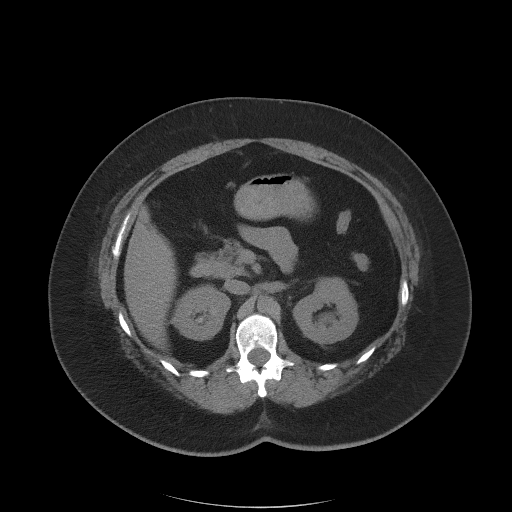
[im 90/114  soft-tissue]
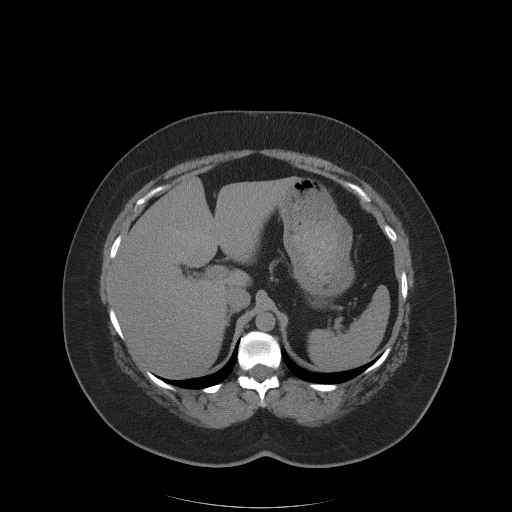
[im 99/114  soft-tissue]
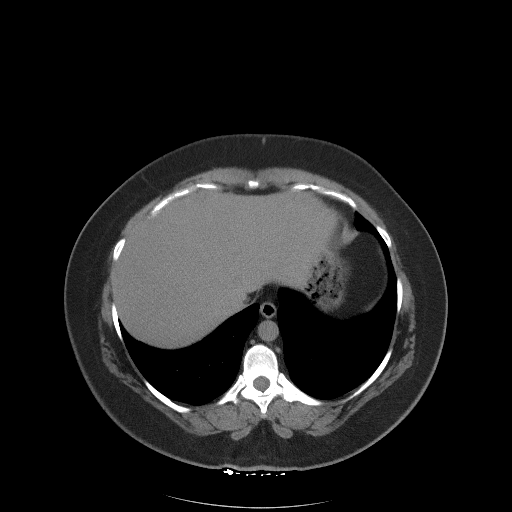
[im 109/114  soft-tissue]
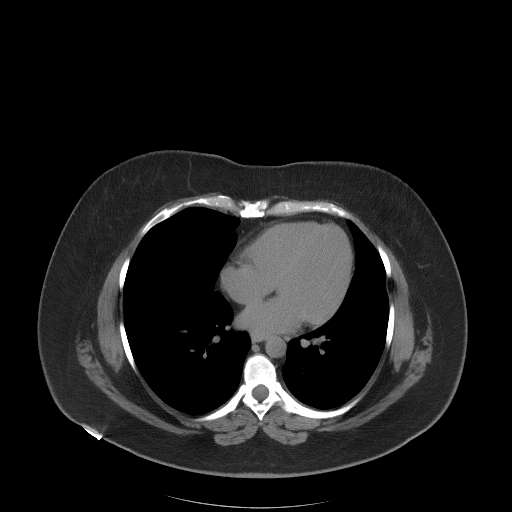

[Series 4: coronal st · coronal · 1.01mm/px · 3 of 126 slices shown]
[im 42/126  soft-tissue]
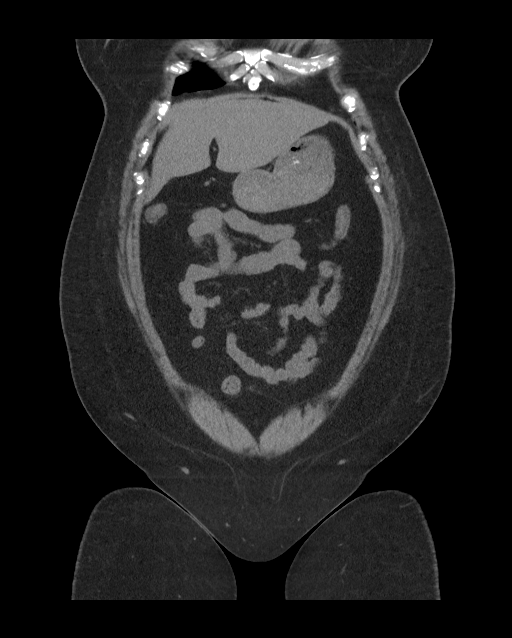
[im 56/126  soft-tissue]
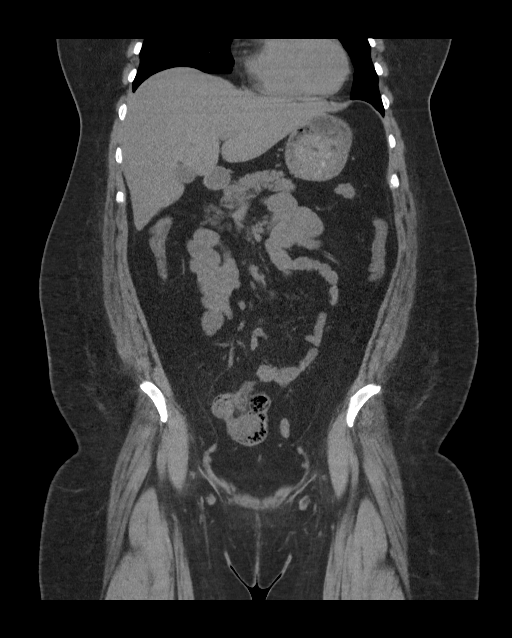
[im 70/126  soft-tissue]
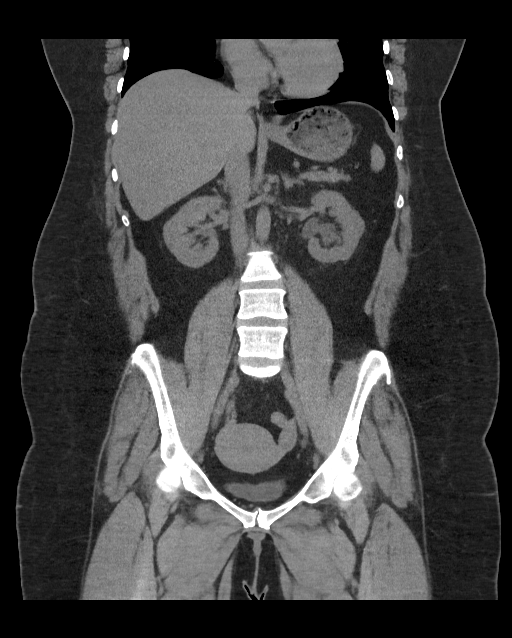

[16 of 46 positions shown; findings below may reference images not displayed]

FINDINGS: Lower chest: Lung bases are clear.

Hepatobiliary: No focal liver lesions are evident on this
noncontrast enhanced study. The gallbladder wall is not appreciably
thickened. There is no biliary duct dilatation.

Pancreas: There is no pancreatic mass or inflammatory focus.

Spleen: No splenic lesions are evident.

Adrenals/Urinary Tract: Adrenals bilaterally appear unremarkable.
There is no evident renal mass on either side. There is moderate
hydronephrosis on the left. There is no appreciable hydronephrosis
on the right. There is no intrarenal calculus on either side. There
is a calculus at the left ureteropelvic junction measuring 8 x 7 mm.
No other ureteral calculi are evident on either side. Urinary
bladder is midline with wall thickness within normal limits.

Stomach/Bowel: There is no appreciable bowel wall or mesenteric
thickening. There is no evident bowel obstruction. The terminal
ileum appears unremarkable. There is no evident free air or portal
venous air.

Vascular/Lymphatic: There is no abdominal aortic aneurysm. No
vascular lesions are evident on this noncontrast enhanced study.
There is no evident adenopathy in the abdomen or pelvis.

Reproductive: Uterus is anteverted.  No evident pelvic mass.

Other: Appendix appears unremarkable. There is no abscess or ascites
in the abdomen or pelvis. There is a small ventral hernia containing
only fat.

Musculoskeletal: There are no blastic or lytic bone lesions. There
is no intramuscular lesion.
IMPRESSION: 1. There is an 8 x 7 mm calculus at the left ureteropelvic junction
with moderate hydronephrosis on the left.

2. No evident bowel obstruction. No abscess in the abdomen or
pelvis. Appendix appears normal.

3.  Small ventral hernia containing only fat.

These results will be called to the ordering clinician or
representative by the Radiologist Assistant, and communication
documented in the PACS or zVision Dashboard.

## 2021-08-26 ENCOUNTER — Encounter: Payer: Self-pay | Admitting: Medical-Surgical

## 2021-08-26 NOTE — Progress Notes (Unsigned)
   Established Patient Office Visit  Subjective   Patient ID: Debra Andrade, female   DOB: 07-12-1980 Age: 41 y.o. MRN: [id number]   No chief complaint on file.   HPI Pleasant 41 year old female presenting today to establish care with a new PCP and for the following:  Mood:  Hypertension:  Obesity:  GERD:  ROS    Objective:    There were no vitals filed for this visit.   Physical Exam    No results found for this or any previous visit (from the past 24 hour(s)).   {Labs (Optional):23779}  The ASCVD Risk score (Arnett DK, et al., 2019) failed to calculate for the following reasons:   The systolic blood pressure is missing   Assessment & Plan:   No problem-specific Assessment & Plan notes found for this encounter.   No follow-ups on file.  ___________________________________________ Thayer Ohm, DNP, APRN, FNP-BC Primary Care and Sports Medicine [facility] [place]

## 2021-08-27 ENCOUNTER — Ambulatory Visit (INDEPENDENT_AMBULATORY_CARE_PROVIDER_SITE_OTHER): Payer: BC Managed Care – PPO | Admitting: Medical-Surgical

## 2021-08-27 ENCOUNTER — Encounter: Payer: Self-pay | Admitting: Medical-Surgical

## 2021-08-27 VITALS — BP 133/80 | HR 92 | Resp 20 | Ht 67.0 in | Wt 283.3 lb

## 2021-08-27 DIAGNOSIS — Z1231 Encounter for screening mammogram for malignant neoplasm of breast: Secondary | ICD-10-CM

## 2021-08-27 DIAGNOSIS — Z1329 Encounter for screening for other suspected endocrine disorder: Secondary | ICD-10-CM

## 2021-08-27 DIAGNOSIS — Z6841 Body Mass Index (BMI) 40.0 and over, adult: Secondary | ICD-10-CM

## 2021-08-27 DIAGNOSIS — Z7689 Persons encountering health services in other specified circumstances: Secondary | ICD-10-CM

## 2021-08-27 DIAGNOSIS — F419 Anxiety disorder, unspecified: Secondary | ICD-10-CM

## 2021-08-27 DIAGNOSIS — Z0000 Encounter for general adult medical examination without abnormal findings: Secondary | ICD-10-CM | POA: Diagnosis not present

## 2021-08-27 DIAGNOSIS — I10 Essential (primary) hypertension: Secondary | ICD-10-CM

## 2021-08-27 DIAGNOSIS — R000 Tachycardia, unspecified: Secondary | ICD-10-CM

## 2021-08-27 DIAGNOSIS — K219 Gastro-esophageal reflux disease without esophagitis: Secondary | ICD-10-CM

## 2021-08-27 DIAGNOSIS — F39 Unspecified mood [affective] disorder: Secondary | ICD-10-CM

## 2021-08-27 MED ORDER — FLUOXETINE HCL 40 MG PO CAPS: 40.0000 mg | ORAL_CAPSULE | Freq: Every day | ORAL | 1 refills | Status: DC

## 2021-08-27 MED ORDER — IRBESARTAN 150 MG PO TABS: 150.0000 mg | ORAL_TABLET | Freq: Every day | ORAL | 1 refills | Status: DC

## 2021-08-27 MED ORDER — PROPRANOLOL HCL ER 80 MG PO CP24: 80.0000 mg | ORAL_CAPSULE | Freq: Every day | ORAL | 1 refills | Status: DC

## 2021-08-27 MED ORDER — CETIRIZINE HCL 10 MG PO TABS: 10.0000 mg | ORAL_TABLET | Freq: Every day | ORAL | 1 refills | Status: DC

## 2021-08-27 MED ORDER — OMEPRAZOLE 20 MG PO CPDR: 20.0000 mg | DELAYED_RELEASE_CAPSULE | Freq: Every day | ORAL | 1 refills | Status: DC

## 2021-08-28 LAB — CBC WITH DIFFERENTIAL/PLATELET
Absolute Monocytes: 444 cells/uL (ref 200–950)
Basophils Absolute: 81 cells/uL (ref 0–200)
Basophils Relative: 1.1 %
Eosinophils Absolute: 59 cells/uL (ref 15–500)
Eosinophils Relative: 0.8 %
HCT: 42.5 % (ref 35.0–45.0)
Hemoglobin: 13.9 g/dL (ref 11.7–15.5)
Lymphs Abs: 2553 cells/uL (ref 850–3900)
MCH: 29.5 pg (ref 27.0–33.0)
MCHC: 32.7 g/dL (ref 32.0–36.0)
MCV: 90.2 fL (ref 80.0–100.0)
MPV: 9.3 fL (ref 7.5–12.5)
Monocytes Relative: 6 %
Neutro Abs: 4262 cells/uL (ref 1500–7800)
Neutrophils Relative %: 57.6 %
Platelets: 335 10*3/uL (ref 140–400)
RBC: 4.71 10*6/uL (ref 3.80–5.10)
RDW: 12.4 % (ref 11.0–15.0)
Total Lymphocyte: 34.5 %
WBC: 7.4 10*3/uL (ref 3.8–10.8)

## 2021-08-28 LAB — COMPLETE METABOLIC PANEL WITH GFR
AG Ratio: 1.8 (calc) (ref 1.0–2.5)
ALT: 16 U/L (ref 6–29)
AST: 14 U/L (ref 10–30)
Albumin: 4.5 g/dL (ref 3.6–5.1)
Alkaline phosphatase (APISO): 68 U/L (ref 31–125)
BUN: 19 mg/dL (ref 7–25)
CO2: 27 mmol/L (ref 20–32)
Calcium: 9.7 mg/dL (ref 8.6–10.2)
Chloride: 102 mmol/L (ref 98–110)
Creat: 0.77 mg/dL (ref 0.50–0.99)
Globulin: 2.5 g/dL (calc) (ref 1.9–3.7)
Glucose, Bld: 96 mg/dL (ref 65–99)
Potassium: 5.1 mmol/L (ref 3.5–5.3)
Sodium: 137 mmol/L (ref 135–146)
Total Bilirubin: 0.7 mg/dL (ref 0.2–1.2)
Total Protein: 7 g/dL (ref 6.1–8.1)
eGFR: 99 mL/min/{1.73_m2} (ref >=60)

## 2021-08-28 LAB — LIPID PANEL
Cholesterol: 232 mg/dL — ABNORMAL HIGH (ref <200)
HDL: 47 mg/dL — ABNORMAL LOW (ref >=50)
LDL Cholesterol (Calc): 159 mg/dL (calc) — ABNORMAL HIGH
Non-HDL Cholesterol (Calc): 185 mg/dL (calc) — ABNORMAL HIGH (ref <130)
Total CHOL/HDL Ratio: 4.9 (calc) (ref <5.0)
Triglycerides: 132 mg/dL (ref <150)

## 2021-08-28 LAB — TSH: TSH: 3.88 mIU/L

## 2021-10-15 ENCOUNTER — Other Ambulatory Visit (HOSPITAL_BASED_OUTPATIENT_CLINIC_OR_DEPARTMENT_OTHER): Payer: Self-pay | Admitting: Medical-Surgical

## 2021-10-15 DIAGNOSIS — Z1231 Encounter for screening mammogram for malignant neoplasm of breast: Secondary | ICD-10-CM

## 2021-10-16 ENCOUNTER — Ambulatory Visit (INDEPENDENT_AMBULATORY_CARE_PROVIDER_SITE_OTHER): Payer: BC Managed Care – PPO

## 2021-10-16 DIAGNOSIS — Z1231 Encounter for screening mammogram for malignant neoplasm of breast: Secondary | ICD-10-CM | POA: Diagnosis not present

## 2022-01-29 ENCOUNTER — Telehealth: Payer: Self-pay | Admitting: Medical-Surgical

## 2022-01-29 DIAGNOSIS — R000 Tachycardia, unspecified: Secondary | ICD-10-CM

## 2022-01-29 DIAGNOSIS — I10 Essential (primary) hypertension: Secondary | ICD-10-CM

## 2022-01-29 MED ORDER — OMEPRAZOLE 20 MG PO CPDR
20.0000 mg | DELAYED_RELEASE_CAPSULE | Freq: Every day | ORAL | 0 refills | Status: DC
Start: 2022-01-29 — End: 2022-04-29

## 2022-01-29 MED ORDER — PROPRANOLOL HCL ER 80 MG PO CP24: 80.0000 mg | ORAL_CAPSULE | Freq: Every day | ORAL | 0 refills | Status: DC

## 2022-01-29 MED ORDER — IRBESARTAN 150 MG PO TABS: 150.0000 mg | ORAL_TABLET | Freq: Every day | ORAL | 0 refills | Status: DC

## 2022-01-29 NOTE — Telephone Encounter (Signed)
Refills sent to her pharmacy as requested.   ___________________________________________ Clearnce Sorrel, DNP, APRN, FNP-BC Primary Care and [place]

## 2022-01-29 NOTE — Telephone Encounter (Signed)
While rescheduling patient to 12/15 due to provider outage, patient requested a temporary refill for medications INDERAL, AVAPRO, and PRILOSEC (if she needs this last one). She states she was given a six month supply and will need a little bit more to make it to her new appointment time of 12/15 (from 12/01). Buel Ream

## 2022-01-30 NOTE — Telephone Encounter (Signed)
Left detailed voice mail message on patient's home # =

## 2022-02-02 ENCOUNTER — Other Ambulatory Visit: Payer: Self-pay | Admitting: Medical-Surgical

## 2022-02-27 ENCOUNTER — Ambulatory Visit: Payer: BC Managed Care – PPO | Admitting: Medical-Surgical

## 2022-03-13 ENCOUNTER — Ambulatory Visit: Payer: BC Managed Care – PPO | Admitting: Medical-Surgical

## 2022-03-13 ENCOUNTER — Encounter: Payer: Self-pay | Admitting: Medical-Surgical

## 2022-03-13 DIAGNOSIS — R000 Tachycardia, unspecified: Secondary | ICD-10-CM | POA: Diagnosis not present

## 2022-03-13 DIAGNOSIS — N200 Calculus of kidney: Secondary | ICD-10-CM | POA: Insufficient documentation

## 2022-03-13 DIAGNOSIS — I10 Essential (primary) hypertension: Secondary | ICD-10-CM

## 2022-03-13 MED ORDER — IPRATROPIUM BROMIDE 0.03 % NA SOLN: 2.0000 | Freq: Two times a day (BID) | NASAL | 0 refills | Status: DC

## 2022-03-13 MED ORDER — IRBESARTAN 150 MG PO TABS: 150.0000 mg | ORAL_TABLET | Freq: Every day | ORAL | 3 refills | Status: DC

## 2022-03-13 MED ORDER — PROPRANOLOL HCL ER 80 MG PO CP24: 80.0000 mg | ORAL_CAPSULE | Freq: Every day | ORAL | 3 refills | Status: DC

## 2022-03-13 MED ORDER — FLUOXETINE HCL 40 MG PO CAPS: 40.0000 mg | ORAL_CAPSULE | Freq: Every day | ORAL | 3 refills | Status: DC

## 2022-03-13 NOTE — Progress Notes (Signed)
Medical screening examination/treatment was performed by qualified nurse practitioner student and as supervising provider I was immediately available for consultation/collaboration. I have reviewed documentation and agree with assessment and plan. ° °Kailiana Granquist L. Amandy Chubbuck, DNP, APRN, FNP-BC °[facility] °Primary Care and Sports Medicine ° °

## 2022-03-13 NOTE — Progress Notes (Deleted)
   Established Patient Office Visit  Subjective   Patient ID: Debra Andrade, female   DOB: [birth year] Age: 41 y.o. MRN: [id number]   Chief Complaint  Patient presents with   Follow-up   Hypertension   HPI    Objective:    Vitals:   03/13/22 1452  BP: 123/76  Pulse: 85  Resp: 20  Height: 5\' 7"  (1.702 m)  Weight: 291 lb 1.6 oz (132 kg)  SpO2: 98%  BMI (Calculated): 45.58    Physical Exam   No results found for this or any previous visit (from the past 24 hour(s)).   {Labs (Optional):23779}  The 10-year ASCVD risk score (Arnett DK, et al., 2019) is: 1.4%   Values used to calculate the score:     Age: 1 years     Sex: Female     Is Non-Hispanic African American: No     Diabetic: No     Tobacco smoker: No     Systolic Blood Pressure: 123 mmHg     Is BP treated: Yes     HDL Cholesterol: 47 mg/dL     Total Cholesterol: 232 mg/dL   Assessment & Plan:   No problem-specific Assessment & Plan notes found for this encounter.   No follow-ups on file.  ___________________________________________ 44, DNP, APRN, FNP-BC Primary Care and Sports Medicine [facility] [place]

## 2022-03-13 NOTE — Progress Notes (Signed)
Established Patient Office Visit  Subjective   Patient ID: Debra Andrade, female    DOB: Apr 11, 1980  Age: 41 y.o. MRN: [id number]  Chief Complaint  Patient presents with   Follow-up   Hypertension    Hypertension Associated symptoms include malaise/fatigue.     Debra Andrade is a pleasant 41 year old female being seen today for follow up on the following chronic diseases.  Morbid obesity:   She reports that since the last visit she has not been able to do any exercises due to low energy so she started taking Vit B12 supplements to see if this will help. She shared that her husband was recently diagnose with diabetes and this motivated her more to make some dietary changes and to start cooking at home.   Hypertension and Sinus tachycardia:  She reports taking irbesartan (Avapro) 150 mg, once daily and propranolol ER (Inderal LA) 80 mg 24 hr capsule daily as prescribed. She stated that she does not check her blood pressure at home. She is motivated to not order out anymore and start cooking at home. She denies any chest pain, palpitations and or shortness of breathe.    Review of Systems  Constitutional:  Positive for malaise/fatigue.  Respiratory: Negative.    Cardiovascular: Negative.   Gastrointestinal: Negative.   Genitourinary: Negative.   Musculoskeletal: Negative.   Skin: Negative.       Objective:     BP 123/76 (BP Location: Right Arm, Cuff Size: Normal)   Pulse 85   Resp 20   Ht 5\' 7"  (1.702 m)   Wt 132 kg   SpO2 98%   BMI 45.59 kg/m    Physical Exam Constitutional:      General: She is not in acute distress.    Appearance: She is obese. She is not ill-appearing, toxic-appearing or diaphoretic.  HENT:     Head: Normocephalic and atraumatic.  Eyes:     General: No scleral icterus. Cardiovascular:     Rate and Rhythm: Normal rate and regular rhythm.     Pulses: Normal pulses.     Heart sounds: Normal heart sounds.  Pulmonary:     Effort: Pulmonary  effort is normal.     Breath sounds: Normal breath sounds.  Skin:    General: Skin is warm and dry.  Neurological:     General: No focal deficit present.     Mental Status: She is alert and oriented to person, place, and time. Mental status is at baseline.  Psychiatric:        Mood and Affect: Mood normal.        Behavior: Behavior normal.        Thought Content: Thought content normal.        Judgment: Judgment normal.      No results found for any visits on 03/13/22.    The 10-year ASCVD risk score (Arnett DK, et al., 2019) is: 1.4%    Assessment & Plan:   1. Morbid obesity (HCC) We provided teaching in regards to importance making changes with habit starting with small ones such as cooking at  home, and to build up activity level. We believe she can benefit greatly from a referral with health wellness to further support her journey.   - Amb Ref to Medical Weight Management  2. Hypertension goal BP (blood pressure) < 130/80 3. Sinus tachycardia Blood pressure and heart rate are at goal today. Continue with the following medication listed below, we also sent refill  to her preferred pharmacy.   - irbesartan (AVAPRO) 150 MG tablet; Take 1 tablet (150 mg total) by mouth daily. Office visit required prior to any further refills  Dispense: 90 tablet; Refill: 3 - propranolol ER (INDERAL LA) 80 MG 24 hr capsule; Take 1 capsule (80 mg total) by mouth daily.  Dispense: 90 capsule; Refill: 3   Return in about 6 months (around 09/12/2022) for annual physical exam (or sooner if needed).    Loretha Stapler, RN Student NP

## 2022-04-29 ENCOUNTER — Other Ambulatory Visit: Payer: Self-pay | Admitting: Medical-Surgical

## 2022-04-29 DIAGNOSIS — K219 Gastro-esophageal reflux disease without esophagitis: Secondary | ICD-10-CM

## 2022-06-14 ENCOUNTER — Other Ambulatory Visit: Payer: Self-pay | Admitting: Medical-Surgical

## 2022-07-01 DIAGNOSIS — I10 Essential (primary) hypertension: Secondary | ICD-10-CM | POA: Diagnosis not present

## 2022-07-01 DIAGNOSIS — Z20822 Contact with and (suspected) exposure to covid-19: Secondary | ICD-10-CM | POA: Diagnosis not present

## 2022-07-01 DIAGNOSIS — Z6841 Body Mass Index (BMI) 40.0 and over, adult: Secondary | ICD-10-CM | POA: Diagnosis not present

## 2022-07-01 DIAGNOSIS — Z03818 Encounter for observation for suspected exposure to other biological agents ruled out: Secondary | ICD-10-CM | POA: Diagnosis not present

## 2022-07-01 DIAGNOSIS — J00 Acute nasopharyngitis [common cold]: Secondary | ICD-10-CM | POA: Diagnosis not present

## 2022-08-27 IMAGING — DX DG KNEE COMPLETE 4+V*L*
4 series · 4 of 4 positions shown · non-contrast
Comparison: None.

CLINICAL DATA: Pain status post fall

EXAM:
LEFT KNEE - COMPLETE 4+ VIEW

[knee ap]
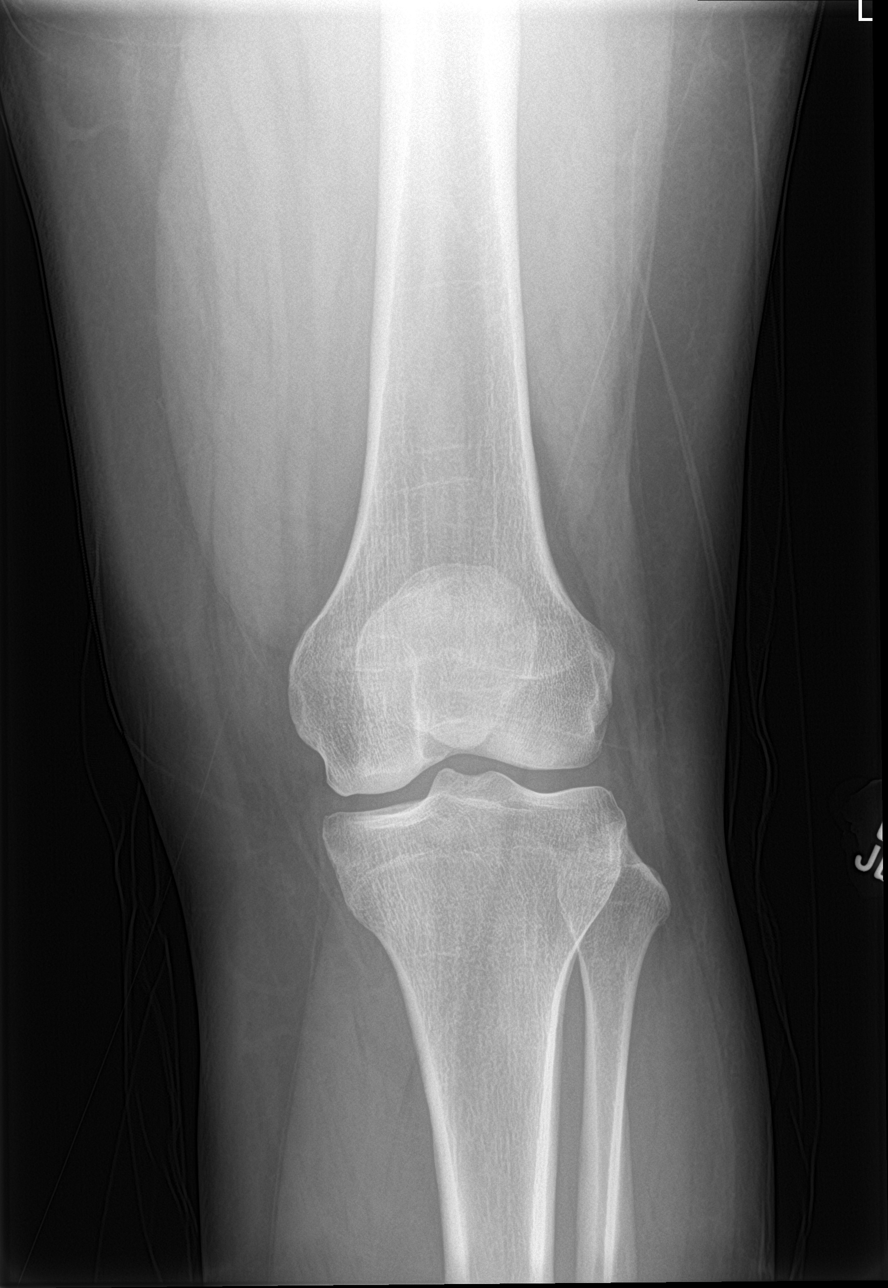

[knee lat]
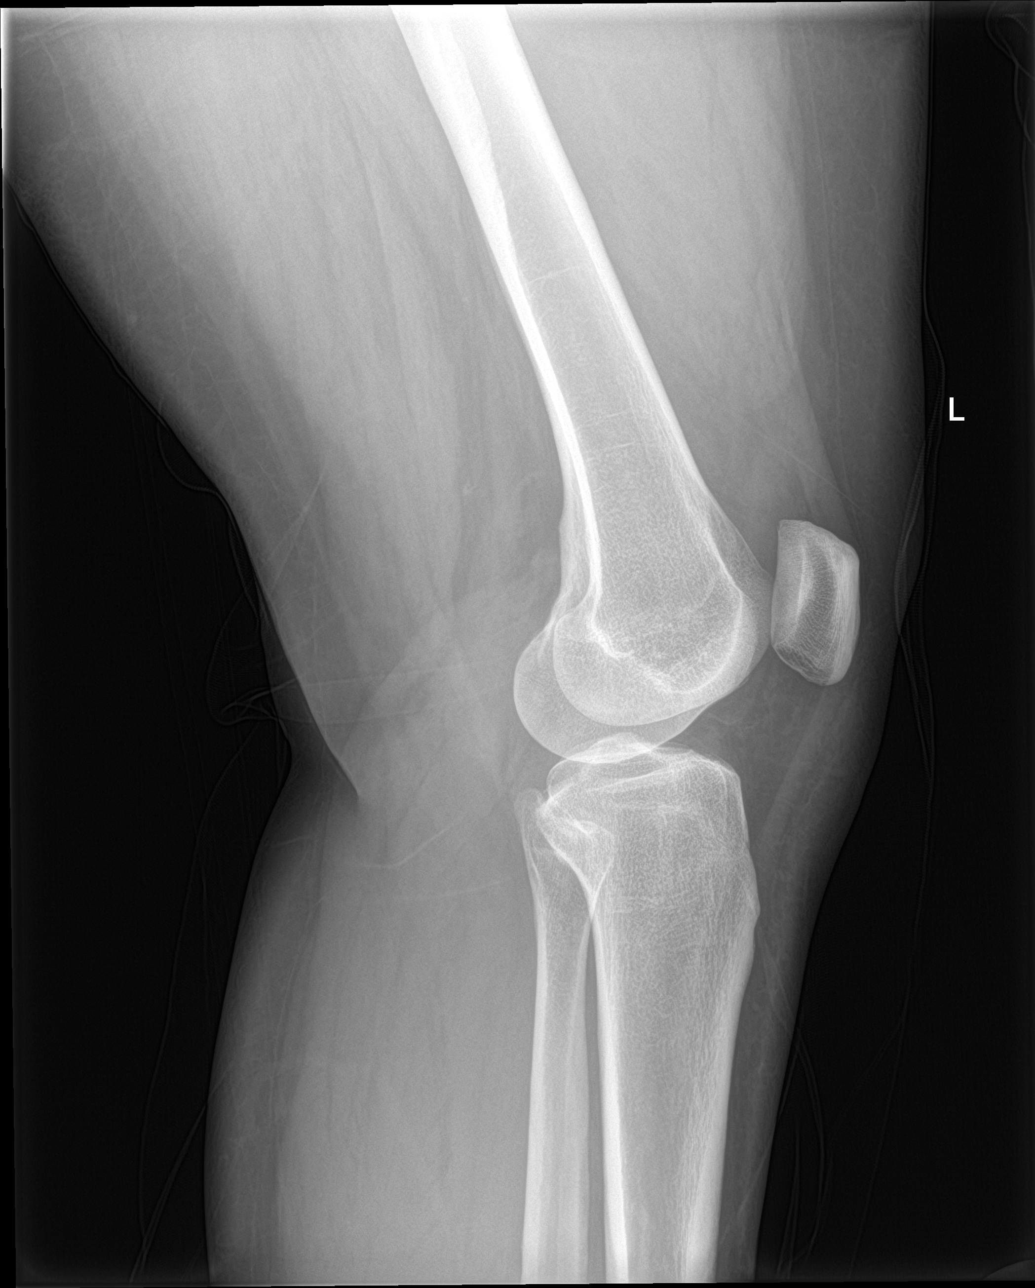

[knee obl (1 of 2)]
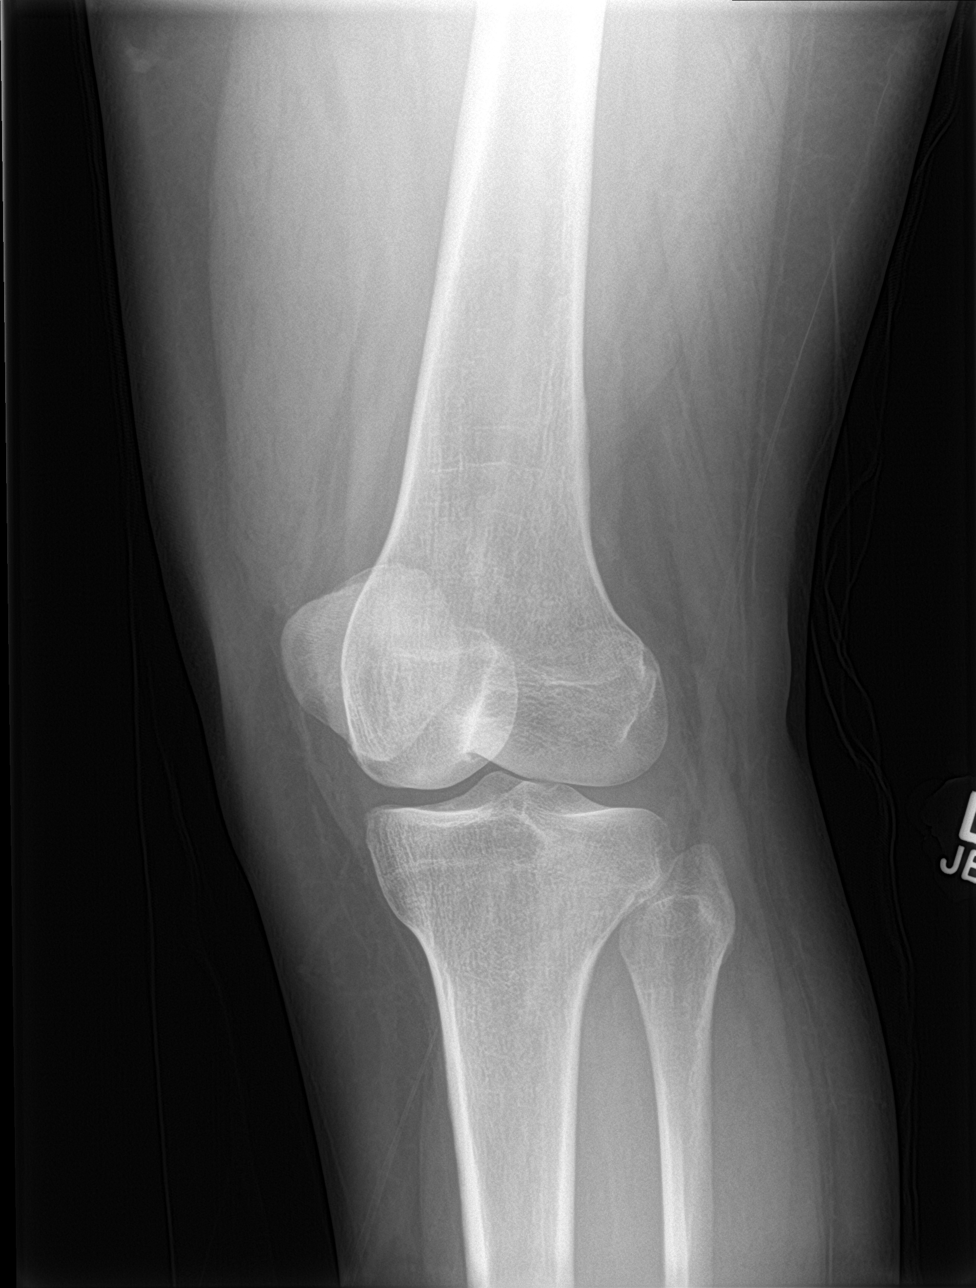

[knee obl (2 of 2)]
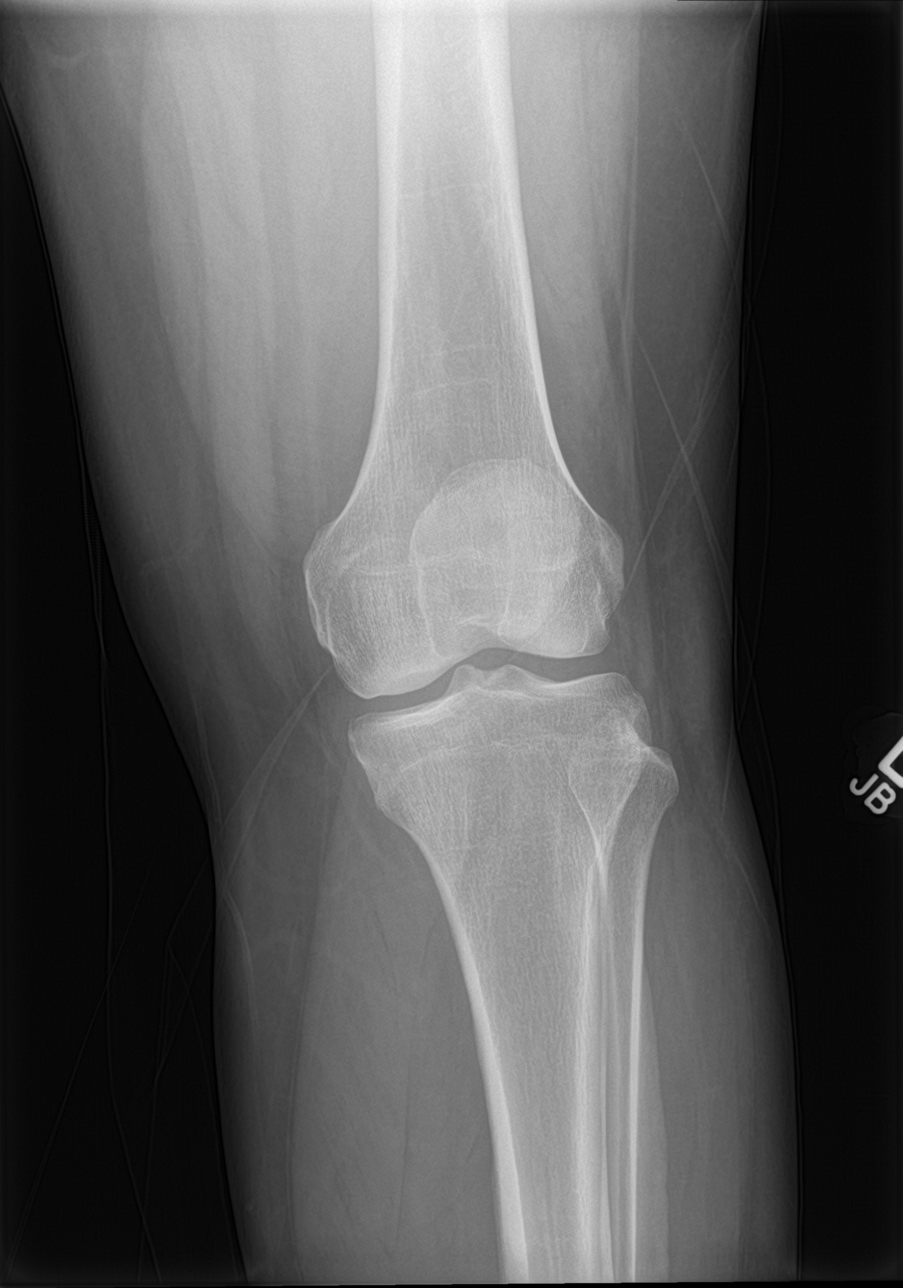

[4 of 4 positions shown; findings below may reference images not displayed]

FINDINGS: No evidence of fracture, dislocation, or joint effusion. No evidence
of arthropathy or other focal bone abnormality. Soft tissues are
unremarkable.
IMPRESSION: Negative.

## 2022-09-17 ENCOUNTER — Ambulatory Visit: Payer: BC Managed Care – PPO | Admitting: Medical-Surgical

## 2022-09-24 ENCOUNTER — Encounter: Payer: BC Managed Care – PPO | Admitting: Medical-Surgical

## 2022-09-25 ENCOUNTER — Encounter: Payer: BC Managed Care – PPO | Admitting: Medical-Surgical

## 2022-12-02 ENCOUNTER — Other Ambulatory Visit: Payer: Self-pay | Admitting: Medical-Surgical

## 2022-12-02 DIAGNOSIS — K219 Gastro-esophageal reflux disease without esophagitis: Secondary | ICD-10-CM

## 2023-02-19 ENCOUNTER — Encounter: Payer: Self-pay | Admitting: Medical-Surgical

## 2023-02-19 ENCOUNTER — Ambulatory Visit (INDEPENDENT_AMBULATORY_CARE_PROVIDER_SITE_OTHER): Payer: BC Managed Care – PPO | Admitting: Medical-Surgical

## 2023-02-19 VITALS — BP 120/79 | HR 81 | Resp 20 | Ht 67.0 in | Wt 298.6 lb

## 2023-02-19 DIAGNOSIS — I10 Essential (primary) hypertension: Secondary | ICD-10-CM | POA: Diagnosis not present

## 2023-02-19 DIAGNOSIS — Z0000 Encounter for general adult medical examination without abnormal findings: Secondary | ICD-10-CM

## 2023-02-19 DIAGNOSIS — E782 Mixed hyperlipidemia: Secondary | ICD-10-CM

## 2023-02-19 DIAGNOSIS — R000 Tachycardia, unspecified: Secondary | ICD-10-CM

## 2023-02-19 DIAGNOSIS — F419 Anxiety disorder, unspecified: Secondary | ICD-10-CM

## 2023-02-19 DIAGNOSIS — F39 Unspecified mood [affective] disorder: Secondary | ICD-10-CM

## 2023-02-19 DIAGNOSIS — G4733 Obstructive sleep apnea (adult) (pediatric): Secondary | ICD-10-CM

## 2023-02-19 DIAGNOSIS — Z1231 Encounter for screening mammogram for malignant neoplasm of breast: Secondary | ICD-10-CM

## 2023-02-19 DIAGNOSIS — Z124 Encounter for screening for malignant neoplasm of cervix: Secondary | ICD-10-CM | POA: Diagnosis not present

## 2023-02-19 MED ORDER — AMBULATORY NON FORMULARY MEDICATION: 0 refills | Status: DC

## 2023-02-19 MED ORDER — FLUOXETINE HCL 40 MG PO CAPS: 40.0000 mg | ORAL_CAPSULE | Freq: Every day | ORAL | 3 refills | Status: DC

## 2023-02-19 MED ORDER — PROPRANOLOL HCL ER 80 MG PO CP24: 80.0000 mg | ORAL_CAPSULE | Freq: Every day | ORAL | 3 refills | Status: DC

## 2023-02-19 MED ORDER — IRBESARTAN 150 MG PO TABS: 150.0000 mg | ORAL_TABLET | Freq: Every day | ORAL | 3 refills | Status: DC

## 2023-02-19 NOTE — Patient Instructions (Signed)
Preventive Care 40-43 Years Old, Female Preventive care refers to lifestyle choices and visits with your health care provider that can promote health and wellness. Preventive care visits are also called wellness exams. What can I expect for my preventive care visit? Counseling Your health care provider may ask you questions about your: Medical history, including: Past medical problems. Family medical history. Pregnancy history. Current health, including: Menstrual cycle. Method of birth control. Emotional well-being. Home life and relationship well-being. Sexual activity and sexual health. Lifestyle, including: Alcohol, nicotine or tobacco, and drug use. Access to firearms. Diet, exercise, and sleep habits. Work and work environment. Sunscreen use. Safety issues such as seatbelt and bike helmet use. Physical exam Your health care provider will check your: Height and weight. These may be used to calculate your BMI (body mass index). BMI is a measurement that tells if you are at a healthy weight. Waist circumference. This measures the distance around your waistline. This measurement also tells if you are at a healthy weight and may help predict your risk of certain diseases, such as type 2 diabetes and high blood pressure. Heart rate and blood pressure. Body temperature. Skin for abnormal spots. What immunizations do I need?  Vaccines are usually given at various ages, according to a schedule. Your health care provider will recommend vaccines for you based on your age, medical history, and lifestyle or other factors, such as travel or where you work. What tests do I need? Screening Your health care provider may recommend screening tests for certain conditions. This may include: Lipid and cholesterol levels. Diabetes screening. This is done by checking your blood sugar (glucose) after you have not eaten for a while (fasting). Pelvic exam and Pap test. Hepatitis B test. Hepatitis C  test. HIV (human immunodeficiency virus) test. STI (sexually transmitted infection) testing, if you are at risk. Lung cancer screening. Colorectal cancer screening. Mammogram. Talk with your health care provider about when you should start having regular mammograms. This may depend on whether you have a family history of breast cancer. BRCA-related cancer screening. This may be done if you have a family history of breast, ovarian, tubal, or peritoneal cancers. Bone density scan. This is done to screen for osteoporosis. Talk with your health care provider about your test results, treatment options, and if necessary, the need for more tests. Follow these instructions at home: Eating and drinking  Eat a diet that includes fresh fruits and vegetables, whole grains, lean protein, and low-fat dairy products. Take vitamin and mineral supplements as recommended by your health care provider. Do not drink alcohol if: Your health care provider tells you not to drink. You are pregnant, may be pregnant, or are planning to become pregnant. If you drink alcohol: Limit how much you have to 0-1 drink a day. Know how much alcohol is in your drink. In the [place] one drink equals one 12 oz bottle of beer (355 mL), one 5 oz glass of wine (148 mL), or one 1 oz glass of hard liquor (44 mL). Lifestyle Brush your teeth every morning and night with fluoride toothpaste. Floss one time each day. Exercise for at least 30 minutes 5 or more days each week. Do not use any products that contain nicotine or tobacco. These products include cigarettes, chewing tobacco, and vaping devices, such as e-cigarettes. If you need help quitting, ask your health care provider. Do not use drugs. If you are sexually active, practice safe sex. Use a condom or other form of protection to   prevent STIs. If you do not wish to become pregnant, use a form of birth control. If you plan to become pregnant, see your health care provider for a  prepregnancy visit. Take aspirin only as told by your health care provider. Make sure that you understand how much to take and what form to take. Work with your health care provider to find out whether it is safe and beneficial for you to take aspirin daily. Find healthy ways to manage stress, such as: Meditation, yoga, or listening to music. Journaling. Talking to a trusted person. Spending time with friends and family. Minimize exposure to UV radiation to reduce your risk of skin cancer. Safety Always wear your seat belt while driving or riding in a vehicle. Do not drive: If you have been drinking alcohol. Do not ride with someone who has been drinking. When you are tired or distracted. While texting. If you have been using any mind-altering substances or drugs. Wear a helmet and other protective equipment during sports activities. If you have firearms in your house, make sure you follow all gun safety procedures. Seek help if you have been physically or sexually abused. What's next? Visit your health care provider once a year for an annual wellness visit. Ask your health care provider how often you should have your eyes and teeth checked. Stay up to date on all vaccines. This information is not intended to replace advice given to you by your health care provider. Make sure you discuss any questions you have with your health care provider. Document Revised: 09/11/2020 Document Reviewed: 09/11/2020 Elsevier Patient Education  2024 [employer] Inc.  

## 2023-02-19 NOTE — Progress Notes (Addendum)
Complete physical exam  Patient: Debra Andrade   DOB: Mar 14, 1981   42 y.o. Female  MRN: [id number]  Subjective:    Chief Complaint  Patient presents with   Annual Exam    Debra Andrade is a 42 y.o. female who presents today for a complete physical exam. She reports consuming a general diet. The patient does not participate in regular exercise at present. Trying to make small changes to increase activity level.  She generally feels fairly well. She reports sleeping fairly well. She does not have additional problems to discuss today.    Most recent fall risk assessment:    02/19/2023    9:25 AM  Fall Risk   Falls in the past year? 0  Number falls in past yr: 0  Injury with Fall? 0  Risk for fall due to : No Fall Risks  Follow up Falls evaluation completed     Most recent depression screenings:    03/13/2022    2:55 PM 08/27/2021   10:08 AM  PHQ 2/9 Scores  PHQ - 2 Score 0 0    Vision:Not within last year , Dental: No current dental problems and No regular dental care , and STD: The patient denies history of sexually transmitted disease.    Patient Care Team: Christen Butter, NP as PCP - General (Nurse Practitioner) Burnard Leigh, MD as Consulting Physician (Psychiatry)   Outpatient Medications Prior to Visit  Medication Sig   ipratropium (ATROVENT) 0.03 % nasal spray ADMINISTER 2 SPRAYS IN BOTH NOSTRILS EVERY 12 HOURS   Levocetirizine Dihydrochloride (XYZAL PO) Take by mouth.   Multiple Vitamin (MULTIVITAMIN ADULT PO) Take by mouth.   omega-3 acid ethyl esters (LOVAZA) 1 g capsule Take by mouth 2 (two) times daily.   Omega-3 Fatty Acids (FISH OIL PO) Take by mouth.   omeprazole (PRILOSEC) 20 MG capsule TAKE 1 CAPSULE BY MOUTH TWICE A DAY BEFORE MEALS (Patient taking differently: Take 20 mg by mouth every other day.)   [DISCONTINUED] FLUoxetine (PROZAC) 40 MG capsule Take 1 capsule (40 mg total) by mouth daily.   [DISCONTINUED] irbesartan (AVAPRO) 150 MG tablet  Take 1 tablet (150 mg total) by mouth daily. Office visit required prior to any further refills   [DISCONTINUED] propranolol ER (INDERAL LA) 80 MG 24 hr capsule Take 1 capsule (80 mg total) by mouth daily.   [DISCONTINUED] B Complex Vitamins (B COMPLEX-B12 PO) Take by mouth.   [DISCONTINUED] cetirizine (ZYRTEC) 10 MG tablet Take 1 tablet (10 mg total) by mouth daily.   No facility-administered medications prior to visit.    Review of Systems  Constitutional:  Negative for chills, fever, malaise/fatigue and weight loss.  HENT:  Negative for congestion, ear pain, hearing loss, sinus pain and sore throat.   Eyes:  Negative for blurred vision, photophobia and pain.  Respiratory:  Negative for cough, shortness of breath and wheezing.   Cardiovascular:  Negative for chest pain, palpitations and leg swelling.  Gastrointestinal:  Negative for abdominal pain, constipation, diarrhea, heartburn, nausea and vomiting.  Genitourinary:  Negative for dysuria, frequency and urgency.  Musculoskeletal:  Negative for falls and neck pain.  Skin:  Negative for itching and rash.  Neurological:  Negative for dizziness, weakness and headaches.  Endo/Heme/Allergies:  Negative for polydipsia. Does not bruise/bleed easily.  Psychiatric/Behavioral:  Negative for depression, substance abuse and suicidal ideas. The patient is not nervous/anxious.      Objective:    BP 120/79 (BP Location: Left Arm, Cuff  Size: Normal)   Pulse 81   Resp 20   Ht 5\' 7"  (1.702 m)   Wt 298 lb 9.6 oz (135.4 kg)   SpO2 95%   BMI 46.77 kg/m    Physical Exam Constitutional:      General: She is not in acute distress.    Appearance: Normal appearance. She is obese. She is not ill-appearing.  HENT:     Head: Normocephalic and atraumatic.     Right Ear: Tympanic membrane, ear canal and external ear normal. There is no impacted cerumen.     Left Ear: Tympanic membrane, ear canal and external ear normal. There is no impacted cerumen.      Nose: Nose normal. No congestion or rhinorrhea.     Mouth/Throat:     Mouth: Mucous membranes are moist.     Pharynx: No oropharyngeal exudate or posterior oropharyngeal erythema.  Eyes:     General: No scleral icterus.       Right eye: No discharge.        Left eye: No discharge.     Extraocular Movements: Extraocular movements intact.     Conjunctiva/sclera: Conjunctivae normal.     Pupils: Pupils are equal, round, and reactive to light.  Neck:     Thyroid: No thyromegaly.     Vascular: No carotid bruit or JVD.     Trachea: Trachea normal.  Cardiovascular:     Rate and Rhythm: Normal rate and regular rhythm.     Pulses: Normal pulses.     Heart sounds: Normal heart sounds. No murmur heard.    No friction rub. No gallop.  Pulmonary:     Effort: Pulmonary effort is normal. No respiratory distress.     Breath sounds: Normal breath sounds. No wheezing.  Abdominal:     General: Bowel sounds are normal. There is no distension.     Palpations: Abdomen is soft.     Tenderness: There is no abdominal tenderness. There is no guarding.  Musculoskeletal:        General: Normal range of motion.     Cervical back: Normal range of motion and neck supple.  Lymphadenopathy:     Cervical: No cervical adenopathy.  Skin:    General: Skin is warm and dry.  Neurological:     Mental Status: She is alert and oriented to person, place, and time.     Cranial Nerves: No cranial nerve deficit.  Psychiatric:        Mood and Affect: Mood normal.        Behavior: Behavior normal.        Thought Content: Thought content normal.        Judgment: Judgment normal.   No results found for any visits on 02/19/23.     Assessment & Plan:    Routine Health Maintenance and Physical Exam  Immunization History  Administered Date(s) Administered   Influenza,inj,Quad PF,6+ Mos 01/13/2018, 12/01/2018   Influenza-Unspecified 01/21/2022   PFIZER(Purple Top)SARS-COV-2 Vaccination 06/23/2019, 07/14/2019    Pfizer Covid-19 Vaccine Bivalent Booster 56yrs & up 02/25/2020, 01/21/2022   Tdap 07/13/2016    Health Maintenance  Topic Date Due   Cervical Cancer Screening (HPV/Pap Cotest)  01/14/2023   COVID-19 Vaccine (5 - 2023-24 season) 03/07/2023 (Originally 11/29/2022)   INFLUENZA VACCINE  06/28/2023 (Originally 10/29/2022)   DTaP/Tdap/Td (2 - Td or Tdap) 07/14/2026   HIV Screening  Completed   HPV VACCINES  Aged Out   Hepatitis C Screening  Discontinued  Discussed health benefits of physical activity, and encouraged her to engage in regular exercise appropriate for her age and condition.  1. Annual physical exam Checking labs as below.  Wellness information provided with AVS.  Up-to-date on preventative care. - CBC with Differential/Platelet - CMP14+EGFR  2. Cervical cancer screening Referring to OB/GYN per patient request. - Ambulatory referral to Obstetrics / Gynecology  3. Hypertension goal BP (blood pressure) < 130/80 Blood pressure looks good today and is at goal.  Checking labs as below.  Continue irbesartan and propranolol ER as prescribed. - CBC with Differential/Platelet - CMP14+EGFR - irbesartan (AVAPRO) 150 MG tablet; Take 1 tablet (150 mg total) by mouth daily.  Dispense: 90 tablet; Refill: 3 - propranolol ER (INDERAL LA) 80 MG 24 hr capsule; Take 1 capsule (80 mg total) by mouth daily.  Dispense: 90 capsule; Refill: 3  4. Mood disorder (HCC) Stable on fluoxetine 40 mg daily.  5. Encounter for screening mammogram for malignant neoplasm of breast Mammogram ordered. - MM DIGITAL SCREENING BILATERAL; Future  6. Mixed hyperlipidemia Checking labs as below.  Continue Lovaza as prescribed. - CMP14+EGFR - Lipid panel  7. Sinus tachycardia Stable.  Continue propranolol ER. - propranolol ER (INDERAL LA) 80 MG 24 hr capsule; Take 1 capsule (80 mg total) by mouth daily.  Dispense: 90 capsule; Refill: 3  8. Anxiety disorder, unspecified type Continue fluoxetine.  9. OSA  (obstructive sleep apnea) Severe sleep apnea noted on a study done in 2021 but is not currently using CPAP.  Never started one due to loss to follow up and financial concerns. Has gained weight and struggles to sleep at night due to apneic episodes and frequent waking. Order for CPAP machine printed and we will send this off to DME to get her started.  If for some reason, the AutoPap setting is not working well for her, we will plan for a CPAP titration study.  10. Morbid obesity (HCC) Checking labs as below.  Long discussion held regarding dietary and activity modifications necessary for weight loss.  Advised her to contact her insurance company to see if she qualifies for weight loss medications under her plan.  If she is interested in starting something, asked her to send me a message or give [place] a phone call. - Hemoglobin A1c - TSH   Return in about 1 year (around 02/19/2024) for annual physical exam.     Christen Butter, NP

## 2023-02-20 LAB — CBC WITH DIFFERENTIAL/PLATELET
Basophils Absolute: 0.1 10*3/uL (ref 0.0–0.2)
Basos: 1 %
EOS (ABSOLUTE): 0.1 10*3/uL (ref 0.0–0.4)
Eos: 2 %
Hematocrit: 43.5 % (ref 34.0–46.6)
Hemoglobin: 13.9 g/dL (ref 11.1–15.9)
Immature Grans (Abs): 0 10*3/uL (ref 0.0–0.1)
Immature Granulocytes: 1 %
Lymphocytes Absolute: 2.9 10*3/uL (ref 0.7–3.1)
Lymphs: 40 %
MCH: 30 pg (ref 26.6–33.0)
MCHC: 32 g/dL (ref 31.5–35.7)
MCV: 94 fL (ref 79–97)
Monocytes Absolute: 0.4 10*3/uL (ref 0.1–0.9)
Monocytes: 5 %
Neutrophils Absolute: 3.8 10*3/uL (ref 1.4–7.0)
Neutrophils: 51 %
Platelets: 353 10*3/uL (ref 150–450)
RBC: 4.64 x10E6/uL (ref 3.77–5.28)
RDW: 13.1 % (ref 11.7–15.4)
WBC: 7.4 10*3/uL (ref 3.4–10.8)

## 2023-02-20 LAB — CMP14+EGFR
ALT: 22 IU/L (ref 0–32)
AST: 17 IU/L (ref 0–40)
Albumin: 4.4 g/dL (ref 3.9–4.9)
Alkaline Phosphatase: 75 IU/L (ref 44–121)
BUN/Creatinine Ratio: 22 (ref 9–23)
BUN: 15 mg/dL (ref 6–24)
Bilirubin Total: 0.3 mg/dL (ref 0.0–1.2)
CO2: 24 mmol/L (ref 20–29)
Calcium: 9.8 mg/dL (ref 8.7–10.2)
Chloride: 102 mmol/L (ref 96–106)
Creatinine, Ser: 0.67 mg/dL (ref 0.57–1.00)
Globulin, Total: 2.5 g/dL (ref 1.5–4.5)
Glucose: 107 mg/dL — ABNORMAL HIGH (ref 70–99)
Potassium: 4.4 mmol/L (ref 3.5–5.2)
Sodium: 138 mmol/L (ref 134–144)
Total Protein: 6.9 g/dL (ref 6.0–8.5)
eGFR: 111 mL/min/1.73

## 2023-02-20 LAB — LIPID PANEL
Chol/HDL Ratio: 4.8 ratio — ABNORMAL HIGH (ref 0.0–4.4)
Cholesterol, Total: 229 mg/dL — ABNORMAL HIGH (ref 100–199)
HDL: 48 mg/dL
LDL Chol Calc (NIH): 144 mg/dL — ABNORMAL HIGH (ref 0–99)
Triglycerides: 206 mg/dL — ABNORMAL HIGH (ref 0–149)
VLDL Cholesterol Cal: 37 mg/dL (ref 5–40)

## 2023-02-20 LAB — HEMOGLOBIN A1C
Est. average glucose Bld gHb Est-mCnc: 120 mg/dL
Hgb A1c MFr Bld: 5.8 % — ABNORMAL HIGH (ref 4.8–5.6)

## 2023-02-20 LAB — TSH: TSH: 2.38 u[IU]/mL (ref 0.450–4.500)

## 2023-02-21 ENCOUNTER — Encounter: Payer: Self-pay | Admitting: Medical-Surgical

## 2023-02-21 DIAGNOSIS — R7303 Prediabetes: Secondary | ICD-10-CM | POA: Insufficient documentation

## 2023-02-22 ENCOUNTER — Encounter: Payer: Self-pay | Admitting: Medical-Surgical

## 2023-03-03 ENCOUNTER — Other Ambulatory Visit: Payer: Self-pay | Admitting: Medical-Surgical

## 2023-03-04 ENCOUNTER — Telehealth: Payer: Self-pay

## 2023-03-04 NOTE — Telephone Encounter (Addendum)
Initiated Prior authorization OVF:IEPPIR 0.25MG /0.5ML auto-injectors Via: Covermymeds Case/Key:[id number] Status: n/a as of 03/04/23 Reason:plan exclusion, please see media tab for denial letter Notified Pt via: Mychart   Initiated Prior authorization JJO:ACZYSAYT 2.5MG /0.5ML pen-injectors Via: Covermymeds Case/Key:[id number] Status: n/a as of 03/04/23 Reason:plan exclusion ,please see media tab for denial letter Notified Pt via: Mychart

## 2023-03-07 MED ORDER — AMBULATORY NON FORMULARY MEDICATION: 0 refills | Status: DC

## 2023-03-07 NOTE — Telephone Encounter (Signed)
Please print the prescription for the CPAP and the office note from 02/19/23. This needs to be faxed to her DME provider.   ___________________________________________ Thayer Ohm, DNP, APRN, FNP-BC Primary Care and Sports Medicine [facility] [place]

## 2023-10-26 NOTE — Procedures (Signed)
Result scanned to media

## 2024-01-14 NOTE — Progress Notes (Signed)
 Debra Andrade                                          MRN: [id number]   01/14/2024   The [facility] Quality Team Specialist reviewed this patient medical record for the purposes of chart review for care gap closure. The following were reviewed: chart review for care gap closure-controlling blood pressure.    [facility] Quality Team

## 2024-02-18 ENCOUNTER — Other Ambulatory Visit: Payer: Self-pay | Admitting: Medical-Surgical

## 2024-02-18 DIAGNOSIS — I10 Essential (primary) hypertension: Secondary | ICD-10-CM

## 2024-02-18 DIAGNOSIS — R000 Tachycardia, unspecified: Secondary | ICD-10-CM

## 2024-02-23 ENCOUNTER — Ambulatory Visit (INDEPENDENT_AMBULATORY_CARE_PROVIDER_SITE_OTHER): Admitting: Medical-Surgical

## 2024-02-23 VITALS — BP 126/81 | HR 83 | Resp 20 | Ht 67.0 in | Wt 304.0 lb

## 2024-02-23 DIAGNOSIS — R000 Tachycardia, unspecified: Secondary | ICD-10-CM

## 2024-02-23 DIAGNOSIS — Z0000 Encounter for general adult medical examination without abnormal findings: Secondary | ICD-10-CM

## 2024-02-23 DIAGNOSIS — E782 Mixed hyperlipidemia: Secondary | ICD-10-CM

## 2024-02-23 DIAGNOSIS — I10 Essential (primary) hypertension: Secondary | ICD-10-CM

## 2024-02-23 DIAGNOSIS — Z23 Encounter for immunization: Secondary | ICD-10-CM

## 2024-02-23 MED ORDER — IRBESARTAN 150 MG PO TABS: 150.0000 mg | ORAL_TABLET | Freq: Every day | ORAL | 3 refills | Status: AC

## 2024-02-23 MED ORDER — PROPRANOLOL HCL ER 80 MG PO CP24: 80.0000 mg | ORAL_CAPSULE | Freq: Every day | ORAL | 3 refills | Status: AC

## 2024-02-23 MED ORDER — FLUOXETINE HCL 40 MG PO CAPS: 40.0000 mg | ORAL_CAPSULE | Freq: Every day | ORAL | 3 refills | Status: AC

## 2024-02-23 NOTE — Progress Notes (Signed)
 Complete physical exam  Patient: Debra Andrade   DOB: 03-29-1981   43 y.o. Female  MRN: [id number]  Subjective:    Chief Complaint  Patient presents with   Annual Exam   Medication Refill    Debra Andrade is a 43 y.o. female who presents today for a complete physical exam. She reports consuming a general diet. The patient does not participate in regular exercise at present. She generally feels fairly well. She reports sleeping fairly well. She does not have additional problems to discuss today.    Most recent fall risk assessment:    02/23/2024    8:26 AM  Fall Risk   Falls in the past year? 0  Number falls in past yr: 0  Injury with Fall? 0  Risk for fall due to : No Fall Risks  Follow up Falls evaluation completed     Most recent depression screenings:    02/23/2024    8:26 AM 03/13/2022    2:55 PM  PHQ 2/9 Scores  PHQ - 2 Score 2 0  PHQ- 9 Score 6     Vision:Within last year and Dental: No current dental problems  Patient Active Problem List   Diagnosis Date Noted   Prediabetes 02/21/2023   Kidney stone 03/13/2022   Chest pain 03/13/2018   Sinus tachycardia 03/13/2018   Hypertension goal BP (blood pressure) < 130/80 03/13/2018   Mood disorder 03/13/2018   Persistent lymphocytosis 02/17/2018   Class 3 severe obesity due to excess calories with serious comorbidity and body mass index (BMI) of 40.0 to 44.9 in adult (HCC) 02/10/2018   Irregular menses 02/10/2018   Fatigue 02/10/2018   Anxiety disorder 05/31/2017   Gastroesophageal reflux disease 05/31/2017   Past Medical History:  Diagnosis Date   Anxiety    Bipolar affective (HCC)    Depression    Family history of adverse reaction to anesthesia    father had hypotension with hip replacement surgery   GERD (gastroesophageal reflux disease)    History of cardiovascular stress test 09/08/2018   History of kidney stones    Hypertension    Obesity    Past Surgical History:  Procedure Laterality Date    EXTRACORPOREAL SHOCK WAVE LITHOTRIPSY Left 03/02/2019   Procedure: EXTRACORPOREAL SHOCK WAVE LITHOTRIPSY (ESWL);  Surgeon: Nieves Cough, MD;  Location: [facility];  Service: Urology;  Laterality: Left;   WISDOM TOOTH EXTRACTION     Social History   Tobacco Use   Smoking status: Never   Smokeless tobacco: Never  Vaping Use   Vaping status: Never Used  Substance Use Topics   Alcohol use: Yes    Alcohol/week: 1.0 standard drink of alcohol    Types: 1 Standard drinks or equivalent per week    Comment: monthly   Drug use: No   Social History   Socioeconomic History   Marital status: Married    Spouse name: Not on file   Number of children: Not on file   Years of education: Not on file   Highest education level: Bachelor's degree (e.g., BA, AB, BS)  Occupational History   Not on file  Tobacco Use   Smoking status: Never   Smokeless tobacco: Never  Vaping Use   Vaping status: Never Used  Substance and Sexual Activity   Alcohol use: Yes    Alcohol/week: 1.0 standard drink of alcohol    Types: 1 Standard drinks or equivalent per week    Comment: monthly   Drug  use: No   Sexual activity: Yes    Birth control/protection: None  Other Topics Concern   Not on file  Social History Narrative   Not on file   Social Drivers of Health   Financial Resource Strain: Medium Risk (02/23/2024)   Overall Financial Resource Strain (CARDIA)    Difficulty of Paying Living Expenses: Somewhat hard  Food Insecurity: Food Insecurity Present (02/23/2024)   Hunger Vital Sign    Worried About Running Out of Food in the Last Year: Sometimes true    Ran Out of Food in the Last Year: Never true  Transportation Needs: No Transportation Needs (02/23/2024)   PRAPARE - Administrator, Civil Service (Medical): No    Lack of Transportation (Non-Medical): No  Physical Activity: Inactive (02/23/2024)   Exercise Vital Sign    Days of Exercise per Week: 0 days    Minutes  of Exercise per Session: Not on file  Stress: Stress Concern Present (02/23/2024)   [employer] of Occupational Health - Occupational Stress Questionnaire    Feeling of Stress: Very much  Social Connections: Moderately Isolated (02/23/2024)   Social Connection and Isolation Panel    Frequency of Communication with Friends and Family: Twice a week    Frequency of Social Gatherings with Friends and Family: Once a week    Attends Religious Services: Never    Database Administrator or Organizations: No    Attends Engineer, Structural: Not on file    Marital Status: Married  Catering Manager Violence: Not on file   Family Status  Relation Name Status   Mother estranged Alive   Father  Alive   Brother  Alive  No partnership data on file   Family History  Problem Relation Age of Onset   Kidney disease Mother    Barrett's esophagus Father    Esophageal cancer Father    Hypertension Father    Hyperlipidemia Father    Bipolar disorder Brother    Allergies  Allergen Reactions   Metformin  And Related Diarrhea   Amoxicillin Rash    Patient Care Team: Willo Mini, NP as PCP - General (Nurse Practitioner) Leila, Marsa Jasper, MD as Consulting Physician (Psychiatry)   Outpatient Medications Prior to Visit  Medication Sig   FLUoxetine  (PROZAC ) 40 MG capsule Take 1 capsule (40 mg total) by mouth daily.   Levocetirizine Dihydrochloride (XYZAL PO) Take by mouth.   Multiple Vitamin (MULTIVITAMIN ADULT PO) Take by mouth.   [DISCONTINUED] AMBULATORY NON FORMULARY MEDICATION Continuous positive airway pressure (CPAP) machine set on AutoPAP (4-20 cmH2O), with all supplemental supplies as needed. Please include humidifier.   [DISCONTINUED] ipratropium (ATROVENT ) 0.03 % nasal spray ADMINISTER 2 SPRAYS IN BOTH NOSTRILS EVERY 12 HOURS   [DISCONTINUED] irbesartan  (AVAPRO ) 150 MG tablet Take 1 tablet (150 mg total) by mouth daily.   [DISCONTINUED] omega-3 acid ethyl esters (LOVAZA)  1 g capsule Take by mouth 2 (two) times daily.   [DISCONTINUED] Omega-3 Fatty Acids (FISH OIL PO) Take by mouth.   [DISCONTINUED] omeprazole  (PRILOSEC) 20 MG capsule TAKE 1 CAPSULE BY MOUTH TWICE A DAY BEFORE MEALS (Patient taking differently: Take 20 mg by mouth every other day.)   [DISCONTINUED] propranolol  ER (INDERAL  LA) 80 MG 24 hr capsule Take 1 capsule (80 mg total) by mouth daily.   No facility-administered medications prior to visit.    Review of Systems  Constitutional: Negative.   HENT: Negative.    Eyes: Negative.   Respiratory: Negative.  Cardiovascular: Negative.   Gastrointestinal: Negative.   Genitourinary: Negative.   Musculoskeletal: Negative.   Skin: Negative.   Neurological: Negative.   Endo/Heme/Allergies: Negative.   Psychiatric/Behavioral: Negative.            Objective:     BP 126/81 (BP Location: Left Arm, Cuff Size: Large)   Pulse 83   Resp 20   Ht 5' 7 (1.702 m)   Wt (!) 137.9 kg   SpO2 97%   BMI 47.62 kg/m  BP Readings from Last 3 Encounters:  02/23/24 126/81  02/19/23 120/79  03/13/22 123/76   Wt Readings from Last 3 Encounters:  02/23/24 (!) 137.9 kg  02/19/23 135.4 kg  03/13/22 132 kg      Physical Exam Vitals and nursing note reviewed.  Constitutional:      General: She is not in acute distress.    Appearance: Normal appearance. She is obese.  HENT:     Head: Normocephalic and atraumatic.     Right Ear: Tympanic membrane and external ear normal. There is no impacted cerumen.     Left Ear: Tympanic membrane, ear canal and external ear normal. There is no impacted cerumen.     Nose: Nose normal.     Mouth/Throat:     Mouth: Mucous membranes are moist.     Pharynx: Oropharynx is clear.  Eyes:     Conjunctiva/sclera: Conjunctivae normal.     Pupils: Pupils are equal, round, and reactive to light.  Cardiovascular:     Rate and Rhythm: Normal rate and regular rhythm.     Pulses: Normal pulses.     Heart sounds: Normal  heart sounds.  Pulmonary:     Effort: Pulmonary effort is normal.     Breath sounds: Normal breath sounds.  Abdominal:     General: Bowel sounds are normal.     Palpations: Abdomen is soft.  Musculoskeletal:        General: Normal range of motion.     Cervical back: Normal range of motion.  Skin:    General: Skin is warm and dry.  Neurological:     General: No focal deficit present.     Mental Status: She is alert and oriented to person, place, and time.  Psychiatric:        Mood and Affect: Mood normal.        Behavior: Behavior normal.        Thought Content: Thought content normal.        Judgment: Judgment normal.      No results found for any visits on 02/23/24. Last CBC Lab Results  Component Value Date   WBC 7.4 02/19/2023   HGB 13.9 02/19/2023   HCT 43.5 02/19/2023   MCV 94 02/19/2023   MCH 30.0 02/19/2023   RDW 13.1 02/19/2023   PLT 353 02/19/2023   Last metabolic panel Lab Results  Component Value Date   GLUCOSE 107 (H) 02/19/2023   NA 138 02/19/2023   K 4.4 02/19/2023   CL 102 02/19/2023   CO2 24 02/19/2023   BUN 15 02/19/2023   CREATININE 0.67 02/19/2023   EGFR 111 02/19/2023   CALCIUM 9.8 02/19/2023   PROT 6.9 02/19/2023   ALBUMIN 4.4 02/19/2023   LABGLOB 2.5 02/19/2023   BILITOT 0.3 02/19/2023   ALKPHOS 75 02/19/2023   AST 17 02/19/2023   ALT 22 02/19/2023   Last lipids Lab Results  Component Value Date   CHOL 229 (H) 02/19/2023   HDL 48  02/19/2023   LDLCALC 144 (H) 02/19/2023   TRIG 206 (H) 02/19/2023   CHOLHDL 4.8 (H) 02/19/2023   Last hemoglobin A1c Lab Results  Component Value Date   HGBA1C 5.8 (H) 02/19/2023   Last thyroid functions Lab Results  Component Value Date   TSH 2.380 02/19/2023      Assessment & Plan:    Routine Health Maintenance and Physical Exam  Immunization History  Administered Date(s) Administered   Influenza,inj,Quad PF,6+ Mos 01/13/2018, 12/01/2018   Influenza-Unspecified 01/21/2022    PFIZER(Purple Top)SARS-COV-2 Vaccination 06/23/2019, 07/14/2019   Pfizer Covid-19 Vaccine Bivalent Booster 36yrs & up 02/25/2020, 01/21/2022   Tdap 07/13/2016    Health Maintenance  Topic Date Due   Hepatitis B Vaccines 19-59 Average Risk (1 of 3 - 19+ 3-dose series) Never done   HPV VACCINES (1 - 3-dose SCDM series) Never done   Cervical Cancer Screening (HPV/Pap Cotest)  01/14/2023   Mammogram  10/17/2023   Influenza Vaccine  10/29/2023   COVID-19 Vaccine (5 - 2025-26 season) 03/10/2024 (Originally 11/29/2023)   DTaP/Tdap/Td (2 - Td or Tdap) 07/14/2026   HIV Screening  Completed   Pneumococcal Vaccine  Aged Out   Meningococcal B Vaccine  Aged Out   Hepatitis C Screening  Discontinued    Discussed health benefits of physical activity, and encouraged her to engage in regular exercise appropriate for her age and condition.  1. Annual physical exam (Primary) -Follow up in 1 year - CBC with Differential/Platelet - CMP14+EGFR  2. Morbid obesity (HCC) - Discussed dietary and exercise changes  - Discussed online resources to help with choosing healthy food options and portion control - TSH - Hemoglobin A1c  3. Mixed hyperlipidemia -repeat Lipid panel today - Lipid panel - CMP14+EGFR  4. Hypertension goal BP (blood pressure) < 130/80 -Continue with current medication regimen - CBC with Differential/Platelet - CMP14+EGFR - irbesartan  (AVAPRO ) 150 MG tablet; Take 1 tablet (150 mg total) by mouth daily.  Dispense: 90 tablet; Refill: 3 - propranolol  ER (INDERAL  LA) 80 MG 24 hr capsule; Take 1 capsule (80 mg total) by mouth daily.  Dispense: 90 capsule; Refill: 3  5. Sinus tachycardia -Continue with current regimen - propranolol  ER (INDERAL  LA) 80 MG 24 hr capsule; Take 1 capsule (80 mg total) by mouth daily.  Dispense: 90 capsule; Refill: 3  6. Need for influenza vaccination -Flu vaccine given today - Flu vaccine trivalent PF, 6mos and older(Flulaval,Afluria,Fluarix,Fluzone)    Return in about 6 months (around 08/22/2024) for chronic disease follow up.    Derrek JINNY Freund, NP Student

## 2024-02-24 ENCOUNTER — Ambulatory Visit: Payer: Self-pay | Admitting: Medical-Surgical

## 2024-02-24 LAB — CBC WITH DIFFERENTIAL/PLATELET
Basophils Absolute: 0.1 x10E3/uL (ref 0.0–0.2)
Basos: 1 %
EOS (ABSOLUTE): 0.1 x10E3/uL (ref 0.0–0.4)
Eos: 1 %
Hematocrit: 41.6 % (ref 34.0–46.6)
Hemoglobin: 13 g/dL (ref 11.1–15.9)
Immature Grans (Abs): 0.1 x10E3/uL (ref 0.0–0.1)
Immature Granulocytes: 1 %
Lymphocytes Absolute: 2.6 x10E3/uL (ref 0.7–3.1)
Lymphs: 33 %
MCH: 29.7 pg (ref 26.6–33.0)
MCHC: 31.3 g/dL — ABNORMAL LOW (ref 31.5–35.7)
MCV: 95 fL (ref 79–97)
Monocytes Absolute: 0.5 x10E3/uL (ref 0.1–0.9)
Monocytes: 6 %
Neutrophils Absolute: 4.7 x10E3/uL (ref 1.4–7.0)
Neutrophils: 58 %
Platelets: 330 x10E3/uL (ref 150–450)
RBC: 4.38 x10E6/uL (ref 3.77–5.28)
RDW: 12.9 % (ref 11.7–15.4)
WBC: 8.1 x10E3/uL (ref 3.4–10.8)

## 2024-02-24 LAB — TSH: TSH: 2.83 u[IU]/mL (ref 0.450–4.500)

## 2024-02-24 LAB — LIPID PANEL
Chol/HDL Ratio: 3.8 ratio (ref 0.0–4.4)
Cholesterol, Total: 207 mg/dL — ABNORMAL HIGH (ref 100–199)
HDL: 55 mg/dL (ref >39)
LDL Chol Calc (NIH): 127 mg/dL — ABNORMAL HIGH (ref 0–99)
Triglycerides: 139 mg/dL (ref 0–149)
VLDL Cholesterol Cal: 25 mg/dL (ref 5–40)

## 2024-02-24 LAB — CMP14+EGFR
ALT: 15 IU/L (ref 0–32)
AST: 15 IU/L (ref 0–40)
Albumin: 4.3 g/dL (ref 3.9–4.9)
Alkaline Phosphatase: 71 IU/L (ref 41–116)
BUN/Creatinine Ratio: 24 — ABNORMAL HIGH (ref 9–23)
BUN: 17 mg/dL (ref 6–24)
Bilirubin Total: 0.5 mg/dL (ref 0.0–1.2)
CO2: 20 mmol/L (ref 20–29)
Calcium: 9.7 mg/dL (ref 8.7–10.2)
Chloride: 100 mmol/L (ref 96–106)
Creatinine, Ser: 0.72 mg/dL (ref 0.57–1.00)
Globulin, Total: 2.1 g/dL (ref 1.5–4.5)
Glucose: 90 mg/dL (ref 70–99)
Potassium: 4.7 mmol/L (ref 3.5–5.2)
Sodium: 137 mmol/L (ref 134–144)
Total Protein: 6.4 g/dL (ref 6.0–8.5)
eGFR: 106 mL/min/1.73 (ref >59)

## 2024-02-24 LAB — HEMOGLOBIN A1C
Est. average glucose Bld gHb Est-mCnc: 111 mg/dL
Hgb A1c MFr Bld: 5.5 % (ref 4.8–5.6)

## 2024-02-24 NOTE — Progress Notes (Signed)
 Medical screening examination/treatment was performed by qualified clinical staff member and as supervising provider I was immediately available for consultation/collaboration. I have reviewed documentation and agree with assessment and plan.  Thayer Ohm, DNP, APRN, FNP-BC [facility]

## 2024-03-27 ENCOUNTER — Other Ambulatory Visit: Payer: Self-pay | Admitting: Medical-Surgical

## 2024-03-27 DIAGNOSIS — Z1231 Encounter for screening mammogram for malignant neoplasm of breast: Secondary | ICD-10-CM

## 2024-04-05 ENCOUNTER — Encounter

## 2024-04-05 DIAGNOSIS — Z1231 Encounter for screening mammogram for malignant neoplasm of breast: Secondary | ICD-10-CM

## 2024-04-10 ENCOUNTER — Ambulatory Visit

## 2024-08-25 ENCOUNTER — Ambulatory Visit: Admitting: Medical-Surgical
